# Patient Record
Sex: Male | Born: 1955 | Race: Black or African American | Hispanic: No | Marital: Married | Smoking: Never smoker
Health system: Southern US, Community
[De-identification: ages and names within clinical notes are randomized; demographics above are authoritative.]

## PROBLEM LIST (undated history)

## (undated) DIAGNOSIS — M1990 Unspecified osteoarthritis, unspecified site: Secondary | ICD-10-CM

## (undated) DIAGNOSIS — G4730 Sleep apnea, unspecified: Secondary | ICD-10-CM

## (undated) DIAGNOSIS — I10 Essential (primary) hypertension: Secondary | ICD-10-CM

## (undated) HISTORY — PX: JOINT REPLACEMENT: SHX530

## (undated) HISTORY — PX: APPENDECTOMY: SHX54

## (undated) HISTORY — PX: LAPAROSCOPIC GASTRIC BANDING: SHX1100

---

## 2006-07-20 ENCOUNTER — Encounter: Admission: RE | Admit: 2006-07-20 | Discharge: 2006-10-18 | Payer: Self-pay | Admitting: Endocrinology

## 2007-02-21 ENCOUNTER — Ambulatory Visit (HOSPITAL_COMMUNITY): Admission: RE | Admit: 2007-02-21 | Discharge: 2007-02-21 | Payer: Self-pay | Admitting: Endocrinology

## 2007-03-27 ENCOUNTER — Ambulatory Visit (HOSPITAL_COMMUNITY): Admission: RE | Admit: 2007-03-27 | Discharge: 2007-03-27 | Payer: Self-pay | Admitting: *Deleted

## 2010-10-11 NOTE — Op Note (Signed)
NAMEDIONICIO, SHELNUTT NO.:  [id number]   MEDICAL RECORD NO.:  [id number]          PATIENT TYPE:  AMB   LOCATION:  ENDO                         FACILITY:  [facility]   PHYSICIAN:  Georgiana Spinner, M.D.    DATE OF BIRTH:  1955-12-25   DATE OF PROCEDURE:  03/26/2007  DATE OF DISCHARGE:                               OPERATIVE REPORT   PROCEDURE:  Colonoscopy.   INDICATIONS:  Abdominal pain, thickening of the colon seen on previous x-  ray exam.   ANESTHESIA:  Demerol 70 mg, Versed 10 mg.   PROCEDURE:  With the patient mildly sedated in the left lateral  decubitus position, a rectal exam was attempted and which was normal to  my limited examination.  Subsequently the Pentax videoscopic colonoscope  was inserted into the rectum, passed under direct vision to the cecum  identified by ileocecal valve and appendiceal orifice both of which were  photographed.  The prep was poor in that there was solid stool coating  the colon in various places mostly on the right side which was not  question on the x-ray but from this point the colonoscope was then  slowly withdrawn taking circumferential views of colonic mucosa,  stopping only then in the rectum which appeared normal on direct and  retroflexed view.  The endoscope was straightened and withdrawn.  The  patient's vital signs, pulse oximeter remained stable.  The patient  tolerated procedure well without apparent complications.   FINDINGS:  Poor prep precluded thorough examination so small lesions,  especially in the right side could be missed there was thickened stool  and that was difficult to suction on the left colon as well but no gross  lesions were seen.   PLAN:  Consider repeat examination in 5 years with improvement prep  quality.           ______________________________  Georgiana Spinner, M.D.     GMO/MEDQ  D:  03/27/2007  T:  03/27/2007  Job:  [id number]

## 2021-03-17 ENCOUNTER — Other Ambulatory Visit: Payer: Self-pay | Admitting: Orthopaedic Surgery

## 2021-03-17 DIAGNOSIS — Z01818 Encounter for other preprocedural examination: Secondary | ICD-10-CM

## 2021-04-07 ENCOUNTER — Encounter (HOSPITAL_COMMUNITY): Payer: No Typology Code available for payment source

## 2021-04-13 NOTE — Progress Notes (Addendum)
PCP - [facility]. [facility]   LOV Vincent Silva, [place] 03-22-21 on chart Cardiologist - no  PPM/ICD -  Device Orders -  Rep Notified -   Chest x-ray -  EKG - 03-22-21 on chart Stress Test -  ECHO -  Cardiac Cath -  CMP,HgbA1c, CBC/diff, PT/PTT,03-22-21 on chart  Sleep Study -  CPAP -   Fasting Blood Sugar - 130-150 Checks Blood Sugar _____ times a day  Blood Thinner Instructions: Aspirin Instructions:  ERAS Protcol - PRE-SURGERY Ensure   COVID TEST- N/A COVID vaccine -Moderna x3  Activity--Goodnow to ambulate in home with cane with no SOB Anesthesia review: HTN  Patient denies shortness of breath, fever, cough and chest pain at PAT appointment   All instructions explained to the patient, with a verbal understanding of the material. Patient agrees to go over the instructions while at home for a better understanding. Patient also instructed to self quarantine after being tested for COVID-19. The opportunity to ask questions was provided.

## 2021-04-13 NOTE — Patient Instructions (Addendum)
DUE TO COVID-19 ONLY ONE VISITOR IS ALLOWED TO COME WITH YOU AND STAY IN THE WAITING ROOM ONLY DURING PRE OP AND PROCEDURE DAY OF SURGERY.   Up to two visitors ages 16+ are allowed at one time in a patient's room.  The visitors may rotate out with other people throughout the day.  Additionally, up to two children between the ages of 91 and 39 are allowed and do not count toward the number of allowed visitors.  Children within this age range must be accompanied by an adult visitor.  One adult visitor may remain with the patient overnight and must be in the room by 8 PM.            Your procedure is scheduled on: 04-19-21   Report to [facility] Main  Entrance   Report to admitting at       0715 AM     Call this number if you have problems the morning of surgery [phone]   Remember: NO SOLID FOOD AFTER MIDNIGHT THE NIGHT PRIOR TO SURGERY. NOTHING BY MOUTH EXCEPT CLEAR LIQUIDS UNTIL    0700 am .   PLEASE FINISH ENSURE DRINK PER SURGEON ORDER  WHICH NEEDS TO BE COMPLETED AT    0700 am the morning of surgery  then nothing by mouth .     CLEAR LIQUID DIET                                                                    water Black Coffee and tea, regular and decaf No Creamer                            Plain Jell-O any favor except red or purple                                  Fruit ices (not with fruit pulp)                                      Iced Popsicles                                     Carbonated beverages, regular and diet                                    Cranberry, grape and apple juices Sports drinks like Gatorade Lightly seasoned clear broth or consume(fat free) Sugar, honey syrup   _____________________________________________________________________     BRUSH YOUR TEETH MORNING OF SURGERY AND RINSE YOUR MOUTH OUT, NO CHEWING GUM CANDY OR MINTS.     Take these medicines the morning of surgery with A SIP OF WATER: metoprolol                                  You may not have any metal on  your body including hair pins and              piercings  Do not wear jewelry, lotions, powders,perfumes,    or    deodorant                        Men may shave face and neck.   Do not bring valuables to the hospital. [facility] IS NOT             RESPONSIBLE   FOR VALUABLES.  Contacts, dentures or bridgework may not be worn into surgery.  You may bring a small overnight bag with you     Patients discharged the day of surgery will not be allowed to drive home. IF YOU ARE HAVING SURGERY AND GOING HOME THE SAME DAY, YOU MUST HAVE AN ADULT TO DRIVE YOU HOME AND BE WITH YOU FOR 24 HOURS. YOU MAY GO HOME BY TAXI OR UBER OR ORTHERWISE, BUT AN ADULT MUST ACCOMPANY YOU HOME AND STAY WITH YOU FOR 24 HOURS.  Name and phone number of your driver:  Special Instructions: N/A              Please read over the following fact sheets you were given: _____________________________________________________________________             [facility] - Preparing for Surgery Before surgery, you can play an important role.  Because skin is not sterile, your skin needs to be as free of germs as possible.  You can reduce the number of germs on your skin by washing with CHG (chlorahexidine gluconate) soap before surgery.  CHG is an antiseptic cleaner which kills germs and bonds with the skin to continue killing germs even after washing. Please DO NOT use if you have an allergy to CHG or antibacterial soaps.  If your skin becomes reddened/irritated stop using the CHG and inform your nurse when you arrive at Short Stay. Do not shave (including legs and underarms) for at least 48 hours prior to the first CHG shower.  You may shave your face/neck. Please follow these instructions carefully:  1.  Shower with CHG Soap the night before surgery and the  morning of Surgery.  2.  If you choose to wash your hair, wash your hair first as usual with your  normal  shampoo.  3.  After you  shampoo, rinse your hair and body thoroughly to remove the  shampoo.                           4.  Use CHG as you would any other liquid soap.  You can apply chg directly  to the skin and wash                       Gently with a scrungie or clean washcloth.  5.  Apply the CHG Soap to your body ONLY FROM THE NECK DOWN.   Do not use on face/ open                           Wound or open sores. Avoid contact with eyes, ears mouth and genitals (private parts).                       Wash face,  Genitals (private parts) with your normal soap.  6.  Wash thoroughly, paying special attention to the area where your surgery  will be performed.  7.  Thoroughly rinse your body with warm water from the neck down.  8.  DO NOT shower/wash with your normal soap after using and rinsing off  the CHG Soap.                9.  Pat yourself dry with a clean towel.            10.  Wear clean pajamas.            11.  Place clean sheets on your bed the night of your first shower and do not  sleep with pets. Day of Surgery : Do not apply any lotions/deodorants the morning of surgery.  Please wear clean clothes to the hospital/surgery center.  FAILURE TO FOLLOW THESE INSTRUCTIONS MAY RESULT IN THE CANCELLATION OF YOUR SURGERY PATIENT SIGNATURE_________________________________  NURSE SIGNATURE__________________________________  ________________________________________________________________________    Vincent Silva  An incentive spirometer is a tool that can help keep your lungs clear and active. This tool measures how well you are filling your lungs with each breath. Taking long deep breaths may help reverse or decrease the chance of developing breathing (pulmonary) problems (especially infection) following: A long period of time when you are unable to move or be active. BEFORE THE PROCEDURE  If the spirometer includes an indicator to show your best effort, your nurse or respiratory therapist will  set it to a desired goal. If possible, sit up straight or lean slightly forward. Try not to slouch. Hold the incentive spirometer in an upright position. INSTRUCTIONS FOR USE  Sit on the edge of your bed if possible, or sit up as far as you can in bed or on a chair. Hold the incentive spirometer in an upright position. Breathe out normally. Place the mouthpiece in your mouth and seal your lips tightly around it. Breathe in slowly and as deeply as possible, raising the piston or the ball toward the top of the column. Hold your breath for 3-5 seconds or for as long as possible. Allow the piston or ball to fall to the bottom of the column. Remove the mouthpiece from your mouth and breathe out normally. Rest for a few seconds and repeat Steps 1 through 7 at least 10 times every 1-2 hours when you are awake. Take your time and take a few normal breaths between deep breaths. The spirometer may include an indicator to show your best effort. Use the indicator as a goal to work toward during each repetition. After each set of 10 deep breaths, practice coughing to be sure your lungs are clear. If you have an incision (the cut made at the time of surgery), support your incision when coughing by placing a pillow or rolled up towels firmly against it. Once you are Zepeda to get out of bed, walk around indoors and cough well. You may stop using the incentive spirometer when instructed by your caregiver.  RISKS AND COMPLICATIONS Take your time so you do not get dizzy or light-headed. If you are in pain, you may need to take or ask for pain medication before doing incentive spirometry. It is harder to take a deep breath if you are having pain. AFTER USE Rest and breathe slowly and easily. It can be helpful to keep track of a log of your progress. Your caregiver can provide you with a simple table to help with this. If you are  using the spirometer at home, follow these instructions: SEEK MEDICAL CARE IF:  You  are having difficultly using the spirometer. You have trouble using the spirometer as often as instructed. Your pain medication is not giving enough relief while using the spirometer. You develop fever of 100.5 F (38.1 C) or higher. SEEK IMMEDIATE MEDICAL CARE IF:  You cough up bloody sputum that had not been present before. You develop fever of 102 F (38.9 C) or greater. You develop worsening pain at or near the incision site. MAKE SURE YOU:  Understand these instructions. Will watch your condition. Will get help right away if you are not doing well or get worse. Document Released: 09/25/2006 Document Revised: 08/07/2011 Document Reviewed: 11/26/2006 [facility] Patient Information 2014 [place], [place].   ________________________________________________________________________

## 2021-04-14 ENCOUNTER — Encounter (HOSPITAL_COMMUNITY): Payer: Self-pay

## 2021-04-14 ENCOUNTER — Ambulatory Visit (HOSPITAL_COMMUNITY)
Admission: RE | Admit: 2021-04-14 | Discharge: 2021-04-14 | Disposition: A | Discharge status: Home / Self Care | Payer: No Typology Code available for payment source | Source: Ambulatory Visit | Attending: Orthopaedic Surgery | Admitting: Orthopaedic Surgery

## 2021-04-14 ENCOUNTER — Encounter (HOSPITAL_COMMUNITY)
Admission: RE | Admit: 2021-04-14 | Discharge: 2021-04-14 | Disposition: A | Discharge status: Home / Self Care | Payer: No Typology Code available for payment source | Source: Ambulatory Visit | Attending: Orthopaedic Surgery | Admitting: Orthopaedic Surgery

## 2021-04-14 ENCOUNTER — Other Ambulatory Visit: Payer: Self-pay

## 2021-04-14 VITALS — BP 127/82 | HR 75 | Temp 97.7°F | Resp 16 | Ht 74.0 in | Wt 297.0 lb

## 2021-04-14 DIAGNOSIS — Z01818 Encounter for other preprocedural examination: Secondary | ICD-10-CM | POA: Diagnosis present

## 2021-04-14 HISTORY — DX: Unspecified osteoarthritis, unspecified site: M19.90

## 2021-04-14 HISTORY — DX: Sleep apnea, unspecified: G47.30

## 2021-04-14 HISTORY — DX: Essential (primary) hypertension: I10

## 2021-04-14 LAB — URINALYSIS, ROUTINE W REFLEX MICROSCOPIC
Bilirubin Urine: NEGATIVE
Glucose, UA: NEGATIVE mg/dL
Hgb urine dipstick: NEGATIVE
Ketones, ur: 5 mg/dL — AB
Nitrite: NEGATIVE
Protein, ur: NEGATIVE mg/dL
Specific Gravity, Urine: 1.02 (ref 1.005–1.030)
pH: 5 (ref 5.0–8.0)

## 2021-04-14 LAB — SURGICAL PCR SCREEN
MRSA, PCR: NEGATIVE
Staphylococcus aureus: NEGATIVE

## 2021-04-18 NOTE — Anesthesia Preprocedure Evaluation (Addendum)
Anesthesia Evaluation  Patient identified by MRN, date of birth, ID band Patient awake    Reviewed: Allergy & Precautions, NPO status , Patient's Chart, lab work & pertinent test results  Airway Mallampati: II  TM Distance: >3 FB Neck ROM: Full    Dental no notable dental hx. (+) Teeth Intact, Dental Advisory Given   Pulmonary sleep apnea and Continuous Positive Airway Pressure Ventilation ,    Pulmonary exam normal breath sounds clear to auscultation       Cardiovascular hypertension, Pt. on medications Normal cardiovascular exam Rhythm:Regular Rate:Normal     Neuro/Psych negative neurological ROS  negative psych ROS   GI/Hepatic Neg liver ROS,   Endo/Other  negative endocrine ROS  Renal/GU      Musculoskeletal  (+) Arthritis ,   Abdominal (+) + obese ( BMI 38.13),   Peds  Hematology   Anesthesia Other Findings   Reproductive/Obstetrics                            Anesthesia Physical Anesthesia Plan  ASA: 2  Anesthesia Plan: Spinal   Post-op Pain Management: Minimal or no pain anticipated   Induction: Intravenous  PONV Risk Score and Plan: 2 and Treatment may vary due to age or medical condition, Ondansetron, Midazolam and Dexamethasone  Airway Management Planned: Nasal Cannula and Natural Airway  Additional Equipment: None  Intra-op Plan:   Post-operative Plan:   Informed Consent: I have reviewed the patients History and Physical, chart, labs and discussed the procedure including the risks, benefits and alternatives for the proposed anesthesia with the patient or authorized representative who has indicated his/her understanding and acceptance.     Dental advisory given  Plan Discussed with:   Anesthesia Plan Comments: (Spinal)       Anesthesia Quick Evaluation

## 2021-04-18 NOTE — H&P (Signed)
TOTAL HIP ADMISSION H&P  Patient is admitted for right total hip arthroplasty.  Subjective:  Chief Complaint: right hip pain  HPI: Vincent Silva, 65 y.o. male, has a history of pain and functional disability in the right hip(s) due to arthritis and patient has failed non-surgical conservative treatments for greater than 12 weeks to include NSAID's and/or analgesics, corticosteriod injections, flexibility and strengthening excercises, use of assistive devices, weight reduction as appropriate, and activity modification.  Onset of symptoms was gradual starting 5 years ago with gradually worsening course since that time.The patient noted no past surgery on the right hip(s).  Patient currently rates pain in the right hip at 10 out of 10 with activity. Patient has night pain, worsening of pain with activity and weight bearing, trendelenberg gait, pain that interfers with activities of daily living, and crepitus. Patient has evidence of subchondral cysts, subchondral sclerosis, periarticular osteophytes, and joint space narrowing by imaging studies. This condition presents safety issues increasing the risk of falls. There is no current active infection.  There are no problems to display for this patient.  Past Medical History:  Diagnosis Date   Arthritis    Hypertension    Sleep apnea    wears cpap    Past Surgical History:  Procedure Laterality Date   APPENDECTOMY     JOINT REPLACEMENT Bilateral    knee   LAPAROSCOPIC GASTRIC BANDING      No current facility-administered medications for this encounter.   Current Outpatient Medications  Medication Sig Dispense Refill Last Dose   celecoxib (CELEBREX) 200 MG capsule Take 200 mg by mouth 2 (two) times daily.      cetirizine (ZYRTEC) 10 MG tablet Take 10 mg by mouth daily as needed for allergies.      cyclobenzaprine (FLEXERIL) 10 MG tablet Take 10 mg by mouth at bedtime as needed for muscle spasms.      fluticasone (FLONASE) 50 MCG/ACT nasal  spray Place 2 sprays into both nostrils daily as needed for allergies or rhinitis.      lisinopril (ZESTRIL) 20 MG tablet Take 20 mg by mouth daily.      lisinopril-hydrochlorothiazide (ZESTORETIC) 20-25 MG tablet Take 1 tablet by mouth daily.      metoprolol succinate (TOPROL-XL) 50 MG 24 hr tablet Take 50 mg by mouth daily. Take with or immediately following a meal.      tadalafil (CIALIS) 20 MG tablet Take 20 mg by mouth daily as needed for erectile dysfunction.      No Known Allergies  Social History   Tobacco Use   Smoking status: Never   Smokeless tobacco: Never  Substance Use Topics   Alcohol use: Yes    Comment: occasional    No family history on file.   Review of Systems  Musculoskeletal:  Positive for arthralgias.       Right hip  All other systems reviewed and are negative.  Objective:  Physical Exam Constitutional:      Appearance: Normal appearance.  HENT:     Head: Normocephalic and atraumatic.     Nose: Nose normal.     Mouth/Throat:     Pharynx: Oropharynx is clear.  Eyes:     Extraocular Movements: Extraocular movements intact.     Conjunctiva/sclera: Conjunctivae normal.  Cardiovascular:     Rate and Rhythm: Normal rate.  Pulmonary:     Effort: Pulmonary effort is normal.  Abdominal:     Palpations: Abdomen is soft.  Musculoskeletal:  Cervical back: Normal range of motion.     Comments: Both hips are limited in terms of motion.  He has a great deal of pain on attempted internal rotation right greater than left.  Leg lengths are roughly equal.  Sensation and motor function are intact in his feet with palpable pulses on both sides.  Skin:    General: Skin is warm and dry.  Neurological:     General: No focal deficit present.     Mental Status: He is alert and oriented to person, place, and time. Mental status is at baseline.  Psychiatric:        Mood and Affect: Mood normal.        Behavior: Behavior normal.        Thought Content: Thought  content normal.        Judgment: Judgment normal.    Vital signs in last 24 hours:    Labs:   Estimated body mass index is 38.13 kg/m as calculated from the following:   Height as of 04/14/21: 6\' 2"  (1.88 m).   Weight as of 04/14/21: 134.7 kg.   Imaging Review Plain radiographs demonstrate severe degenerative joint disease of the right hip(s). The bone quality appears to be good for age and reported activity level.   Assessment/Plan:  End stage primary arthritis, right hip(s)  The patient history, physical examination, clinical judgement of the provider and imaging studies are consistent with end stage degenerative joint disease of the right hip(s) and total hip arthroplasty is deemed medically necessary. The treatment options including medical management, injection therapy, arthroscopy and arthroplasty were discussed at length. The risks and benefits of total hip arthroplasty were presented and reviewed. The risks due to aseptic loosening, infection, stiffness, dislocation/subluxation,  thromboembolic complications and other imponderables were discussed.  The patient acknowledged the explanation, agreed to proceed with the plan and consent was signed. Patient is being admitted for inpatient treatment for surgery, pain control, PT, OT, prophylactic antibiotics, VTE prophylaxis, progressive ambulation and ADL's and discharge planning.The patient is planning to be discharged home with home health services

## 2021-04-19 ENCOUNTER — Ambulatory Visit (HOSPITAL_COMMUNITY): Payer: No Typology Code available for payment source

## 2021-04-19 ENCOUNTER — Ambulatory Visit (HOSPITAL_COMMUNITY): Payer: No Typology Code available for payment source | Admitting: Anesthesiology

## 2021-04-19 ENCOUNTER — Observation Stay (HOSPITAL_COMMUNITY)
Admission: RE | Admit: 2021-04-19 | Discharge: 2021-04-19 | Disposition: A | Discharge status: Home / Self Care | Payer: No Typology Code available for payment source | Source: Ambulatory Visit | Attending: Orthopaedic Surgery | Admitting: Orthopaedic Surgery

## 2021-04-19 ENCOUNTER — Encounter (HOSPITAL_COMMUNITY): Payer: Self-pay | Admitting: Orthopaedic Surgery

## 2021-04-19 ENCOUNTER — Encounter (HOSPITAL_COMMUNITY)
Admission: RE | Disposition: A | Discharge status: Home / Self Care | Payer: Self-pay | Source: Ambulatory Visit | Attending: Orthopaedic Surgery

## 2021-04-19 DIAGNOSIS — Z96653 Presence of artificial knee joint, bilateral: Secondary | ICD-10-CM | POA: Insufficient documentation

## 2021-04-19 DIAGNOSIS — M1611 Unilateral primary osteoarthritis, right hip: Principal | ICD-10-CM | POA: Diagnosis present

## 2021-04-19 DIAGNOSIS — I10 Essential (primary) hypertension: Secondary | ICD-10-CM | POA: Insufficient documentation

## 2021-04-19 DIAGNOSIS — Z419 Encounter for procedure for purposes other than remedying health state, unspecified: Secondary | ICD-10-CM

## 2021-04-19 DIAGNOSIS — Z79899 Other long term (current) drug therapy: Secondary | ICD-10-CM | POA: Diagnosis not present

## 2021-04-19 HISTORY — PX: TOTAL HIP ARTHROPLASTY: SHX124

## 2021-04-19 LAB — TYPE AND SCREEN
ABO/RH(D): O POS
Antibody Screen: NEGATIVE

## 2021-04-19 LAB — ABO/RH: ABO/RH(D): O POS

## 2021-04-19 SURGERY — ARTHROPLASTY, HIP, TOTAL, ANTERIOR APPROACH
Anesthesia: Spinal | Site: Hip | Laterality: Right

## 2021-04-19 MED ORDER — ORAL CARE MOUTH RINSE: 15.0000 mL | Freq: Once | OROMUCOSAL | Status: AC

## 2021-04-19 MED ORDER — BUPIVACAINE LIPOSOME 1.3 % IJ SUSP
INTRAMUSCULAR | Status: AC
  Filled 2021-04-19: qty 10

## 2021-04-19 MED ORDER — BUPIVACAINE IN DEXTROSE 0.75-8.25 % IT SOLN
INTRATHECAL | Status: DC | PRN
  Administered 2021-04-19: 13.5 mg via INTRATHECAL

## 2021-04-19 MED ORDER — LACTATED RINGERS IV SOLN: INTRAVENOUS | Status: DC

## 2021-04-19 MED ORDER — ALUM & MAG HYDROXIDE-SIMETH 200-200-20 MG/5ML PO SUSP: 30.0000 mL | ORAL | Status: DC | PRN

## 2021-04-19 MED ORDER — ONDANSETRON HCL 4 MG/2ML IJ SOLN: 4.0000 mg | Freq: Four times a day (QID) | INTRAMUSCULAR | Status: DC | PRN

## 2021-04-19 MED ORDER — METOCLOPRAMIDE HCL 5 MG PO TABS
5.0000 mg | ORAL_TABLET | Freq: Three times a day (TID) | ORAL | Status: DC | PRN
Start: 2021-04-19 — End: 2021-04-19
  Filled 2021-04-19: qty 2

## 2021-04-19 MED ORDER — MENTHOL 3 MG MT LOZG: 1.0000 | LOZENGE | OROMUCOSAL | Status: DC | PRN

## 2021-04-19 MED ORDER — HYDROCODONE-ACETAMINOPHEN 5-325 MG PO TABS: 1.0000 | ORAL_TABLET | Freq: Four times a day (QID) | ORAL | 0 refills | Status: DC | PRN

## 2021-04-19 MED ORDER — METHOCARBAMOL 500 MG PO TABS: 500.0000 mg | ORAL_TABLET | Freq: Four times a day (QID) | ORAL | Status: DC | PRN

## 2021-04-19 MED ORDER — CEFAZOLIN SODIUM-DEXTROSE 2-4 GM/100ML-% IV SOLN
2.0000 g | Freq: Four times a day (QID) | INTRAVENOUS | Status: DC
  Administered 2021-04-19: 2 g via INTRAVENOUS

## 2021-04-19 MED ORDER — FENTANYL CITRATE (PF) 100 MCG/2ML IJ SOLN
INTRAMUSCULAR | Status: AC
  Filled 2021-04-19: qty 2

## 2021-04-19 MED ORDER — TRANEXAMIC ACID 1000 MG/10ML IV SOLN
INTRAVENOUS | Status: DC | PRN
  Administered 2021-04-19: 2000 mg via TOPICAL

## 2021-04-19 MED ORDER — HYDROCODONE-ACETAMINOPHEN 5-325 MG PO TABS
1.0000 | ORAL_TABLET | ORAL | Status: DC | PRN
  Administered 2021-04-19: 2 via ORAL

## 2021-04-19 MED ORDER — PHENOL 1.4 % MT LIQD: 1.0000 | OROMUCOSAL | Status: DC | PRN

## 2021-04-19 MED ORDER — ACETAMINOPHEN 325 MG PO TABS: 325.0000 mg | ORAL_TABLET | Freq: Four times a day (QID) | ORAL | Status: DC | PRN

## 2021-04-19 MED ORDER — PHENYLEPHRINE HCL (PRESSORS) 10 MG/ML IV SOLN
INTRAVENOUS | Status: AC
  Filled 2021-04-19: qty 1

## 2021-04-19 MED ORDER — PROPOFOL 500 MG/50ML IV EMUL
INTRAVENOUS | Status: DC | PRN
  Administered 2021-04-19: 100 ug/kg/min via INTRAVENOUS

## 2021-04-19 MED ORDER — KETOROLAC TROMETHAMINE 15 MG/ML IJ SOLN
15.0000 mg | Freq: Four times a day (QID) | INTRAMUSCULAR | Status: DC
  Administered 2021-04-19: 15 mg via INTRAVENOUS

## 2021-04-19 MED ORDER — CEFAZOLIN IN SODIUM CHLORIDE 3-0.9 GM/100ML-% IV SOLN
3.0000 g | INTRAVENOUS | Status: AC
  Administered 2021-04-19: 3 g via INTRAVENOUS
  Filled 2021-04-19: qty 100

## 2021-04-19 MED ORDER — LACTATED RINGERS IV BOLUS
250.0000 mL | Freq: Once | INTRAVENOUS | Status: AC
  Administered 2021-04-19: 250 mL via INTRAVENOUS

## 2021-04-19 MED ORDER — MORPHINE SULFATE (PF) 4 MG/ML IV SOLN
INTRAVENOUS | Status: AC
  Filled 2021-04-19: qty 1

## 2021-04-19 MED ORDER — CHLORHEXIDINE GLUCONATE 0.12 % MT SOLN
15.0000 mL | Freq: Once | OROMUCOSAL | Status: AC
  Administered 2021-04-19: 15 mL via OROMUCOSAL

## 2021-04-19 MED ORDER — HYDROCODONE-ACETAMINOPHEN 5-325 MG PO TABS
ORAL_TABLET | ORAL | Status: AC
  Filled 2021-04-19: qty 2

## 2021-04-19 MED ORDER — TRANEXAMIC ACID-NACL 1000-0.7 MG/100ML-% IV SOLN
INTRAVENOUS | Status: AC
  Administered 2021-04-19: 1000 mg
  Filled 2021-04-19: qty 100

## 2021-04-19 MED ORDER — EPHEDRINE SULFATE-NACL 50-0.9 MG/10ML-% IV SOSY
PREFILLED_SYRINGE | INTRAVENOUS | Status: DC | PRN
  Administered 2021-04-19 (×2): 10 mg via INTRAVENOUS
  Administered 2021-04-19: 5 mg via INTRAVENOUS

## 2021-04-19 MED ORDER — METHOCARBAMOL 500 MG IVPB - SIMPLE MED
INTRAVENOUS | Status: AC
  Filled 2021-04-19: qty 50

## 2021-04-19 MED ORDER — MORPHINE SULFATE (PF) 4 MG/ML IV SOLN
0.5000 mg | INTRAVENOUS | Status: DC | PRN
Start: 2021-04-19 — End: 2021-04-19
  Administered 2021-04-19: 1 mg via INTRAVENOUS

## 2021-04-19 MED ORDER — HYDROCODONE-ACETAMINOPHEN 7.5-325 MG PO TABS: 1.0000 | ORAL_TABLET | ORAL | Status: DC | PRN

## 2021-04-19 MED ORDER — BUPIVACAINE-EPINEPHRINE (PF) 0.25% -1:200000 IJ SOLN
INTRAMUSCULAR | Status: AC
  Filled 2021-04-19: qty 30

## 2021-04-19 MED ORDER — BUPIVACAINE-EPINEPHRINE (PF) 0.25% -1:200000 IJ SOLN
INTRAMUSCULAR | Status: DC | PRN
  Administered 2021-04-19: 30 mL

## 2021-04-19 MED ORDER — POVIDONE-IODINE 10 % EX SWAB
2.0000 "application " | Freq: Once | CUTANEOUS | Status: AC
  Administered 2021-04-19: 2 via TOPICAL

## 2021-04-19 MED ORDER — DOCUSATE SODIUM 100 MG PO CAPS: 100.0000 mg | ORAL_CAPSULE | Freq: Two times a day (BID) | ORAL | Status: DC

## 2021-04-19 MED ORDER — DIPHENHYDRAMINE HCL 12.5 MG/5ML PO ELIX
12.5000 mg | ORAL_SOLUTION | ORAL | Status: DC | PRN
  Filled 2021-04-19: qty 10

## 2021-04-19 MED ORDER — ASPIRIN 81 MG PO CHEW: 81.0000 mg | CHEWABLE_TABLET | Freq: Two times a day (BID) | ORAL | Status: DC

## 2021-04-19 MED ORDER — BUPIVACAINE LIPOSOME 1.3 % IJ SUSP: 10.0000 mL | Freq: Once | INTRAMUSCULAR | Status: DC

## 2021-04-19 MED ORDER — CEFAZOLIN SODIUM-DEXTROSE 2-4 GM/100ML-% IV SOLN
INTRAVENOUS | Status: AC
  Filled 2021-04-19: qty 100

## 2021-04-19 MED ORDER — TRANEXAMIC ACID-NACL 1000-0.7 MG/100ML-% IV SOLN: 1000.0000 mg | Freq: Once | INTRAVENOUS | Status: DC

## 2021-04-19 MED ORDER — METHOCARBAMOL 500 MG IVPB - SIMPLE MED
500.0000 mg | Freq: Four times a day (QID) | INTRAVENOUS | Status: DC | PRN
  Administered 2021-04-19: 500 mg via INTRAVENOUS

## 2021-04-19 MED ORDER — ASPIRIN EC 81 MG PO TBEC: 81.0000 mg | DELAYED_RELEASE_TABLET | Freq: Two times a day (BID) | ORAL | 0 refills | Status: DC

## 2021-04-19 MED ORDER — ONDANSETRON HCL 4 MG PO TABS
4.0000 mg | ORAL_TABLET | Freq: Four times a day (QID) | ORAL | Status: DC | PRN
  Filled 2021-04-19: qty 1

## 2021-04-19 MED ORDER — PHENYLEPHRINE 40 MCG/ML (10ML) SYRINGE FOR IV PUSH (FOR BLOOD PRESSURE SUPPORT)
PREFILLED_SYRINGE | INTRAVENOUS | Status: AC
  Filled 2021-04-19: qty 10

## 2021-04-19 MED ORDER — MIDAZOLAM HCL 5 MG/5ML IJ SOLN
INTRAMUSCULAR | Status: DC | PRN
  Administered 2021-04-19: 2 mg via INTRAVENOUS

## 2021-04-19 MED ORDER — 0.9 % SODIUM CHLORIDE (POUR BTL) OPTIME
TOPICAL | Status: DC | PRN
  Administered 2021-04-19: 1000 mL

## 2021-04-19 MED ORDER — TRANEXAMIC ACID 1000 MG/10ML IV SOLN
2000.0000 mg | INTRAVENOUS | Status: DC
  Filled 2021-04-19: qty 20

## 2021-04-19 MED ORDER — FENTANYL CITRATE (PF) 100 MCG/2ML IJ SOLN
INTRAMUSCULAR | Status: DC | PRN
  Administered 2021-04-19 (×2): 50 ug via INTRAVENOUS

## 2021-04-19 MED ORDER — TRANEXAMIC ACID-NACL 1000-0.7 MG/100ML-% IV SOLN
1000.0000 mg | INTRAVENOUS | Status: AC
  Administered 2021-04-19: 1000 mg via INTRAVENOUS
  Filled 2021-04-19: qty 100

## 2021-04-19 MED ORDER — MIDAZOLAM HCL 2 MG/2ML IJ SOLN
INTRAMUSCULAR | Status: AC
  Filled 2021-04-19: qty 2

## 2021-04-19 MED ORDER — TIZANIDINE HCL 4 MG PO TABS: 4.0000 mg | ORAL_TABLET | Freq: Four times a day (QID) | ORAL | 1 refills | Status: DC | PRN

## 2021-04-19 MED ORDER — ACETAMINOPHEN 500 MG PO TABS: 500.0000 mg | ORAL_TABLET | Freq: Four times a day (QID) | ORAL | Status: DC

## 2021-04-19 MED ORDER — METOCLOPRAMIDE HCL 5 MG/ML IJ SOLN: 5.0000 mg | Freq: Three times a day (TID) | INTRAMUSCULAR | Status: DC | PRN

## 2021-04-19 MED ORDER — BISACODYL 5 MG PO TBEC: 5.0000 mg | DELAYED_RELEASE_TABLET | Freq: Every day | ORAL | Status: DC | PRN

## 2021-04-19 MED ORDER — BUPIVACAINE LIPOSOME 1.3 % IJ SUSP
INTRAMUSCULAR | Status: DC | PRN
  Administered 2021-04-19: 10 mL

## 2021-04-19 MED ORDER — LACTATED RINGERS IV BOLUS
500.0000 mL | Freq: Once | INTRAVENOUS | Status: AC
  Administered 2021-04-19: 500 mL via INTRAVENOUS

## 2021-04-19 MED ORDER — EPHEDRINE 5 MG/ML INJ
INTRAVENOUS | Status: AC
  Filled 2021-04-19: qty 5

## 2021-04-19 MED ORDER — KETOROLAC TROMETHAMINE 15 MG/ML IJ SOLN
INTRAMUSCULAR | Status: AC
  Filled 2021-04-19: qty 1

## 2021-04-19 MED ORDER — PROPOFOL 10 MG/ML IV BOLUS
INTRAVENOUS | Status: AC
  Filled 2021-04-19: qty 20

## 2021-04-19 MED ORDER — KETAMINE HCL 10 MG/ML IJ SOLN
INTRAMUSCULAR | Status: AC
  Filled 2021-04-19: qty 1

## 2021-04-19 MED ORDER — PHENYLEPHRINE HCL-NACL 20-0.9 MG/250ML-% IV SOLN
INTRAVENOUS | Status: DC | PRN
  Administered 2021-04-19: 40 ug/min via INTRAVENOUS

## 2021-04-19 SURGICAL SUPPLY — 43 items
BAG COUNTER SPONGE SURGICOUNT (BAG) ×2 IMPLANT
BAG DECANTER FOR FLEXI CONT (MISCELLANEOUS) ×2 IMPLANT
BLADE SAW SGTL 18X1.27X75 (BLADE) ×2 IMPLANT
BOOTIES KNEE HIGH SLOAN (MISCELLANEOUS) ×2 IMPLANT
CELLS DAT CNTRL 66122 CELL SVR (MISCELLANEOUS) ×1 IMPLANT
COVER PERINEAL POST (MISCELLANEOUS) ×2 IMPLANT
COVER SURGICAL LIGHT HANDLE (MISCELLANEOUS) ×2 IMPLANT
CUP PINN GRIPTON 54 100 (Cup) ×2 IMPLANT
DECANTER SPIKE VIAL GLASS SM (MISCELLANEOUS) ×2 IMPLANT
DRAPE FOOT SWITCH (DRAPES) ×2 IMPLANT
DRAPE IMP U-DRAPE 54X76 (DRAPES) ×2 IMPLANT
DRAPE STERI IOBAN 125X83 (DRAPES) ×2 IMPLANT
DRAPE U-SHAPE 47X51 STRL (DRAPES) ×4 IMPLANT
DRSG AQUACEL AG ADV 3.5X 6 (GAUZE/BANDAGES/DRESSINGS) ×2 IMPLANT
DURAPREP 26ML APPLICATOR (WOUND CARE) ×2 IMPLANT
ELECT BLADE TIP CTD 4 INCH (ELECTRODE) ×2 IMPLANT
ELECT REM PT RETURN 15FT ADLT (MISCELLANEOUS) ×2 IMPLANT
ELIMINATOR HOLE APEX DEPUY (Hips) ×2 IMPLANT
GLOVE SRG 8 PF TXTR STRL LF DI (GLOVE) ×2 IMPLANT
GLOVE SURG ENC MOIS LTX SZ8 (GLOVE) ×4 IMPLANT
GLOVE SURG UNDER POLY LF SZ8 (GLOVE) ×4
GOWN STRL REUS W/TWL XL LVL3 (GOWN DISPOSABLE) ×4 IMPLANT
HEAD CERAMIC DELTA 36 PLUS 1.5 (Hips) ×2 IMPLANT
HOLDER FOLEY CATH W/STRAP (MISCELLANEOUS) ×2 IMPLANT
KIT TURNOVER KIT A (KITS) IMPLANT
LINER NEUTRAL 54X36MM PLUS 4 (Hips) ×2 IMPLANT
MANIFOLD NEPTUNE II (INSTRUMENTS) ×2 IMPLANT
NEEDLE HYPO 22GX1.5 SAFETY (NEEDLE) ×2 IMPLANT
NS IRRIG 1000ML POUR BTL (IV SOLUTION) ×2 IMPLANT
PACK ANTERIOR HIP CUSTOM (KITS) ×2 IMPLANT
PENCIL SMOKE EVACUATOR (MISCELLANEOUS) IMPLANT
PROTECTOR NERVE ULNAR (MISCELLANEOUS) ×2 IMPLANT
RTRCTR WOUND ALEXIS 18CM MED (MISCELLANEOUS) ×2
SPONGE T-LAP 18X18 ~~LOC~~+RFID (SPONGE) ×4 IMPLANT
STEM FEMORAL SZ9 HIGH ACTIS (Stem) ×2 IMPLANT
SUT ETHIBOND NAB CT1 #1 30IN (SUTURE) ×4 IMPLANT
SUT VIC AB 1 CT1 36 (SUTURE) ×2 IMPLANT
SUT VIC AB 2-0 CT1 27 (SUTURE) ×2
SUT VIC AB 2-0 CT1 TAPERPNT 27 (SUTURE) ×1 IMPLANT
SUT VICRYL AB 3-0 FS1 BRD 27IN (SUTURE) ×2 IMPLANT
SUT VLOC 180 0 24IN GS25 (SUTURE) ×2 IMPLANT
SYR 50ML LL SCALE MARK (SYRINGE) ×2 IMPLANT
TRAY FOLEY MTR SLVR 16FR STAT (SET/KITS/TRAYS/PACK) ×2 IMPLANT

## 2021-04-19 NOTE — Evaluation (Signed)
Physical Therapy Evaluation Patient Details Name: Vincent Silva MRN: [id number] DOB: 03/27/1956 Today's Date: 04/19/2021  History of Present Illness  Patient is 65 y.o. male s/p Rt THA anterior approach on 04/19/21 with PMH significant for OA, HTN, bil TKA.  Clinical Impression  Vincent Silva is a 65 y.o. male POD 0 s/p Rt THA. Patient reports independence with [facility] for mobility at baseline. Patient is now limited by functional impairments (see PT problem list below) and requires min guard for transfers and gait with RW. Patient was [place]ward to ambulate ~100 feet with RW and min guard and cues for safe walker management. Patient educated on safe sequencing for stair mobility and verbalized safe guarding position for people assisting with mobility. Pt's wife educated on safe guarding/assist to provide pt with gait and stair mobility. Patient instructed in exercises to facilitate ROM and circulation. Patient will benefit from continued skilled PT interventions to address impairments and progress towards PLOF. Patient has met mobility goals at adequate level for discharge home; will continue to follow if pt continues acute stay to progress towards Mod I goals.        Recommendations for follow up therapy are one component of a multi-disciplinary discharge planning process, led by the attending physician.  Recommendations may be updated based on patient status, additional functional criteria and insurance authorization.  Follow Up Recommendations Follow physician's recommendations for discharge plan and follow up therapies    Assistance Recommended at Discharge Frequent or constant Supervision/Assistance  Functional Status Assessment Patient has had a recent decline in their functional status and demonstrates the ability to make significant improvements in function in a reasonable and predictable amount of time.  Equipment Recommendations  None recommended by PT    Recommendations for Other Services        Precautions / Restrictions Precautions Precautions: Fall Restrictions Weight Bearing Restrictions: No Other Position/Activity Restrictions: WBAT      Mobility  Bed Mobility Overal bed mobility: Needs Assistance Bed Mobility: Supine to Sit     Supine to sit: Min guard;Supervision;HOB elevated     General bed mobility comments: pt taking extra time, cues for use of belt to assist Lt LE.    Transfers Overall transfer level: Needs assistance Equipment used: Rolling walker (2 wheels) Transfers: Sit to/from Stand Sit to Stand: Min guard;From elevated surface           General transfer comment: cues for technique/hand placement on RW, no overt LOB noted with rise. pt with good power up using bil UE's from EOB and elevated recliner.    Ambulation/Gait Ambulation/Gait assistance: Min guard Gait Distance (Feet): 100 Feet Assistive device: Rolling walker (2 wheels) Gait Pattern/deviations: Step-to pattern;Decreased stride length;Decreased weight shift to right;Trunk flexed Gait velocity: decr     General Gait Details: cues for step to pattern and proximity to RW, no overt LOB noted and pt steady with wife providing guarding with cues/supervision from therapist.  Stairs Stairs: Yes Stairs assistance: Min guard Stair Management: One rail Left;Step to pattern;Forwards;With cane Number of Stairs: 3 General stair comments: cuse for sequencing up with good, down with bad and SPC placement. no overt LOB noted. pt's wife provided guarding and assist with cues/instructions from therapist.  Wheelchair Mobility    Modified Rankin (Stroke Patients Only)       Balance Overall balance assessment: Needs assistance Sitting-balance support: Feet supported Sitting balance-Leahy Scale: Good     Standing balance support: During functional activity;Reliant on assistive device for balance;Bilateral upper extremity  supported Standing balance-Leahy Scale: Poor                                Pertinent Vitals/Pain Pain Assessment: Faces Faces Pain Scale: Hurts little more Pain Location: Rt hip Pain Descriptors / Indicators: Aching;Discomfort Pain Intervention(s): Limited activity within patient's tolerance;Monitored during session;Repositioned;Premedicated before session;Ice applied    Home Living Family/patient expects to be discharged to:: Private residence Living Arrangements: Spouse/significant other Available Help at Discharge: Family Type of Home: House Home Access: Stairs to enter Entrance Stairs-Rails: Left Entrance Stairs-Number of Steps: 6   Home Layout: One level Home Equipment: Conservation officer, nature (2 wheels);Cane - single point;BSC/3in1;Toilet riser Additional Comments: pt's wife will assist with his recovery    Prior Function Prior Level of Function : Independent/Modified Independent             Mobility Comments: using SPC       Hand Dominance   Dominant Hand: Right    Extremity/Trunk Assessment   Upper Extremity Assessment Upper Extremity Assessment: Overall WFL for tasks assessed    Lower Extremity Assessment Lower Extremity Assessment: Overall WFL for tasks assessed;RLE deficits/detail RLE: Unable to fully assess due to pain RLE Sensation: WNL RLE Coordination: WNL    Cervical / Trunk Assessment Cervical / Trunk Assessment: Normal;Other exceptions Cervical / Trunk Exceptions: habitus  Communication   Communication: No difficulties  Cognition Arousal/Alertness: Awake/alert Behavior During Therapy: WFL for tasks assessed/performed Overall Cognitive Status: Within Functional Limits for tasks assessed                                          General Comments      Exercises Total Joint Exercises Ankle Circles/Pumps: AROM;Both;Seated;10 reps Quad Sets: AROM;Right;Other reps (comment);Seated Short Arc Quad: AROM;Right;Other reps (comment);Seated Heel Slides: AROM;Right;Other reps  (comment);Seated Hip ABduction/ADduction: AROM;Right;Other reps (comment);Seated   Assessment/Plan    PT Assessment Patient needs continued PT services  PT Problem List Decreased strength;Decreased activity tolerance;Decreased range of motion;Decreased balance;Decreased mobility;Decreased knowledge of use of DME;Decreased knowledge of precautions;Pain       PT Treatment Interventions DME instruction;Gait training;Stair training;Functional mobility training;Therapeutic activities;Therapeutic exercise;Balance training;Patient/family education    PT Goals (Current goals can be found in the Care Plan section)  Acute Rehab PT Goals Patient Stated Goal: get recovered to go home today PT Goal Formulation: With patient Time For Goal Achievement: 04/26/21 Potential to Achieve Goals: Good    Frequency 7X/week   Barriers to discharge        Co-evaluation               AM-PAC PT "6 Clicks" Mobility  Outcome Measure Help needed turning from your back to your side while in a flat bed without using bedrails?: A Little Help needed moving from lying on your back to sitting on the side of a flat bed without using bedrails?: A Little Help needed moving to and from a bed to a chair (including a wheelchair)?: A Little Help needed standing up from a chair using your arms (e.g., wheelchair or bedside chair)?: A Little Help needed to walk in hospital room?: A Little Help needed climbing 3-5 steps with a railing? : A Little 6 Click Score: 18    End of Session Equipment Utilized During Treatment: Gait belt Activity Tolerance: Patient tolerated treatment well Patient left: in chair;with  family/visitor present;with call bell/phone within reach Nurse Communication: Mobility status PT Visit Diagnosis: Muscle weakness (generalized) (M62.81);Difficulty in walking, not elsewhere classified (R26.2)    Time: 0160-1093 PT Time Calculation (min) (ACUTE ONLY): 64 min   Charges:   PT Evaluation $PT  Eval Low Complexity: 1 Low PT Treatments $Gait Training: 23-37 mins $Therapeutic Exercise: 8-22 mins        Verner Mould, DPT Acute Rehabilitation Services Office [phone] Pager [phone]   Jacques Navy 04/19/2021, 5:32 PM

## 2021-04-19 NOTE — Anesthesia Postprocedure Evaluation (Signed)
Anesthesia Post Note  Patient: Vincent Silva  Procedure(s) Performed: RIGHT TOTAL HIP ARTHROPLASTY ANTERIOR APPROACH (Right: Hip)     Patient location during evaluation: Nursing Unit Anesthesia Type: Spinal Level of consciousness: oriented and awake and alert Pain management: pain level controlled Vital Signs Assessment: post-procedure vital signs reviewed and stable Respiratory status: spontaneous breathing and respiratory function stable Cardiovascular status: blood pressure returned to baseline and stable Postop Assessment: no headache, no backache, no apparent nausea or vomiting and [name] to bend at knees Anesthetic complications: no   No notable events documented.  Last Vitals:  Vitals:   04/19/21 1100 04/19/21 1115  BP: 119/83 134/87  Pulse: (!) 48 (!) 51  Resp: 16 12  Temp:    SpO2: 100% 94%    Last Pain:  Vitals:   04/19/21 1115  TempSrc:   PainSc: 0-No pain                 Trevor Iha

## 2021-04-19 NOTE — Op Note (Signed)

## 2021-04-19 NOTE — Anesthesia Procedure Notes (Signed)
Spinal  Patient location during procedure: OR Start time: 04/19/2021 7:32 AM End time: 04/19/2021 7:40 AM Reason for block: surgical anesthesia Staffing Performed: anesthesiologist  Anesthesiologist: Trevor Iha, MD Preanesthetic Checklist Completed: patient identified, IV checked, risks and benefits discussed, surgical consent, monitors and equipment checked, pre-op evaluation and timeout performed Spinal Block Patient position: sitting Prep: DuraPrep and site prepped and draped Patient monitoring: heart rate, cardiac monitor, continuous pulse ox and blood pressure Approach: midline Location: L3-4 Injection technique: single-shot Needle Needle type: Pencan  Needle gauge: 24 G Needle length: 10 cm Needle insertion depth: 7 cm Assessment Sensory level: T4 Events: CSF return Additional Notes 2 Attempt (s). Pt tolerated procedure well.

## 2021-04-19 NOTE — Interval H&P Note (Signed)
History and Physical Interval Note:  04/19/2021 7:25 AM  Vincent Silva  has presented today for surgery, with the diagnosis of RIGHT HIP DEGENERATIVE JOINT DISEASE.  The various methods of treatment have been discussed with the patient and family. After consideration of risks, benefits and other options for treatment, the patient has consented to  Procedure(s): RIGHT TOTAL HIP ARTHROPLASTY ANTERIOR APPROACH (Right) as a surgical intervention.  The patient's history has been reviewed, patient examined, no change in status, stable for surgery.  I have reviewed the patient's chart and labs.  Questions were answered to the patient's satisfaction.     Velna Ochs

## 2021-04-19 NOTE — Transfer of Care (Signed)
Immediate Anesthesia Transfer of Care Note  Patient: Vincent Silva  Procedure(s) Performed: RIGHT TOTAL HIP ARTHROPLASTY ANTERIOR APPROACH (Right: Hip)  Patient Location: PACU  Anesthesia Type:MAC and Spinal  Level of Consciousness: awake, alert , oriented and patient cooperative  Airway & Oxygen Therapy: Patient Spontanous Breathing and Patient connected to face mask oxygen  Post-op Assessment: Report given to RN and Post -op Vital signs reviewed and stable  Post vital signs: Reviewed and stable  Last Vitals:  Vitals Value Taken Time  BP    Temp    Pulse    Resp    SpO2      Last Pain:  Vitals:   04/19/21 0604  TempSrc: Oral  PainSc:          Complications: No notable events documented.

## 2021-04-20 ENCOUNTER — Encounter (HOSPITAL_COMMUNITY): Payer: Self-pay | Admitting: Orthopaedic Surgery

## 2021-07-13 DIAGNOSIS — Z9889 Other specified postprocedural states: Secondary | ICD-10-CM | POA: Diagnosis not present

## 2021-07-13 DIAGNOSIS — M25551 Pain in right hip: Secondary | ICD-10-CM | POA: Diagnosis not present

## 2021-07-19 ENCOUNTER — Other Ambulatory Visit: Payer: Self-pay | Admitting: Orthopaedic Surgery

## 2021-07-21 DIAGNOSIS — M159 Polyosteoarthritis, unspecified: Secondary | ICD-10-CM | POA: Diagnosis not present

## 2021-07-21 DIAGNOSIS — I10 Essential (primary) hypertension: Secondary | ICD-10-CM | POA: Diagnosis not present

## 2021-07-21 DIAGNOSIS — Z01818 Encounter for other preprocedural examination: Secondary | ICD-10-CM | POA: Diagnosis not present

## 2021-07-21 DIAGNOSIS — E7800 Pure hypercholesterolemia, unspecified: Secondary | ICD-10-CM | POA: Diagnosis not present

## 2021-08-09 NOTE — Progress Notes (Signed)
DUE TO COVID-19 ONLY ONE VISITOR IS ALLOWED TO COME WITH YOU AND STAY IN THE WAITING ROOM ONLY DURING PRE OP AND PROCEDURE DAY OF SURGERY.  2 VISITOR  MAY VISIT WITH YOU AFTER SURGERY IN YOUR PRIVATE ROOM DURING VISITING HOURS ONLY! ?YOU MAY HAVE ONE PERSON SPEND THE NITE WITH YOU IN YOUR ROOM AFTER SURGERY.   ? ? ? ? Your procedure is scheduled on:  ?  08/16/21  ? Report to [facility] Main  Entrance ? ? Report to admitting at    0515            AM ?DO NOT BRING INSURANCE CARD, PICTURE ID OR WALLET DAY OF SURGERY.  ?  ? ? Call this number if you have problems the morning of surgery [phone]  ? ? REMEMBER: NO  SOLID FOODS , CANDY, GUM OR MINTS AFTER MIDNITE THE NITE BEFORE SURGERY .       Marland Kitchen CLEAR LIQUIDS UNTIL     0430am           DAY OF SURGERY.      PLEASE FINISH ENSURE DRINK PER SURGEON ORDER  WHICH NEEDS TO BE COMPLETED AT      0430am      MORNING OF SURGERY.   ? ? ? ? ?CLEAR LIQUID DIET ? ? ?Foods Allowed      ?WATER ?BLACK COFFEE ( SUGAR OK, NO MILK, CREAM OR CREAMER) REGULAR AND DECAF  ?TEA ( SUGAR OK NO MILK, CREAM, OR CREAMER) REGULAR AND DECAF  ?PLAIN JELLO ( NO RED)  ?FRUIT ICES ( NO RED, NO FRUIT PULP)  ?POPSICLES ( NO RED)  ?JUICE- APPLE, WHITE GRAPE AND WHITE CRANBERRY  ?SPORT DRINK LIKE GATORADE ( NO RED)  ?CLEAR BROTH ( VEGETABLE , CHICKEN OR BEEF)                                                               ? ?    ? ?BRUSH YOUR TEETH MORNING OF SURGERY AND RINSE YOUR MOUTH OUT, NO CHEWING GUM CANDY OR MINTS. ?  ? ? Take these medicines the morning of surgery with A SIP OF WATER:  toprol  ? ? ?DO NOT TAKE ANY DIABETIC MEDICATIONS DAY OF YOUR SURGERY ?                  ?            You may not have any metal on your body including hair pins and  ?            piercings  Do not wear jewelry, make-up, lotions, powders or perfumes, deodorant ?            Do not wear nail polish on your fingernails.   ?           IF YOU ARE A MALE AND WANT TO SHAVE UNDER ARMS OR LEGS PRIOR TO SURGERY YOU  MUST DO SO AT LEAST 48 HOURS PRIOR TO SURGERY.  ?            Men may shave face and neck. ? ? Do not bring valuables to the hospital. [facility] IS NOT ?            RESPONSIBLE   FOR  VALUABLES. ? Contacts, dentures or bridgework may not be worn into surgery. ? Leave suitcase in the car. After surgery it may be brought to your room. ? ?  ? Patients discharged the day of surgery will not be allowed to drive home. IF YOU ARE HAVING SURGERY AND GOING HOME THE SAME DAY, YOU MUST HAVE AN ADULT TO DRIVE YOU HOME AND BE WITH YOU FOR 24 HOURS. YOU MAY GO HOME BY TAXI OR UBER OR ORTHERWISE, BUT AN ADULT MUST ACCOMPANY YOU HOME AND STAY WITH YOU FOR 24 HOURS. ?  ? ?            Please read over the following fact sheets you were given: ?_____________________________________________________________________ ? ?[place] - Preparing for Surgery ?Before surgery, you can play an important role.  Because skin is not sterile, your skin needs to be as free of germs as possible.  You can reduce the number of germs on your skin by washing with CHG (chlorahexidine gluconate) soap before surgery.  CHG is an antiseptic cleaner which kills germs and bonds with the skin to continue killing germs even after washing. ?Please DO NOT use if you have an allergy to CHG or antibacterial soaps.  If your skin becomes reddened/irritated stop using the CHG and inform your nurse when you arrive at Short Stay. ?Do not shave (including legs and underarms) for at least 48 hours prior to the first CHG shower.  You may shave your face/neck. ?Please follow these instructions carefully: ? 1.  Shower with CHG Soap the night before surgery and the  morning of Surgery. ? 2.  If you choose to wash your hair, wash your hair first as usual with your  normal  shampoo. ? 3.  After you shampoo, rinse your hair and body thoroughly to remove the  shampoo.                           4.  Use CHG as you would any other liquid soap.  You can apply chg directly  to the skin  and wash  ?                     Gently with a scrungie or clean washcloth. ? 5.  Apply the CHG Soap to your body ONLY FROM THE NECK DOWN.   Do not use on face/ open      ?                     Wound or open sores. Avoid contact with eyes, ears mouth and genitals (private parts).  ?                     Engineering geologist,  Genitals (private parts) with your normal soap. ?            6.  Wash thoroughly, paying special attention to the area where your surgery  will be performed. ? 7.  Thoroughly rinse your body with warm water from the neck down. ? 8.  DO NOT shower/wash with your normal soap after using and rinsing off  the CHG Soap. ?               9.  Pat yourself dry with a clean towel. ?           10.  Wear clean pajamas. ?  11.  Place clean sheets on your bed the night of your first shower and do not  sleep with pets. ?Day of Surgery : ?Do not apply any lotions/deodorants the morning of surgery.  Please wear clean clothes to the hospital/surgery center. ? ?FAILURE TO FOLLOW THESE INSTRUCTIONS MAY RESULT IN THE CANCELLATION OF YOUR SURGERY ?PATIENT SIGNATURE_________________________________ ? ?NURSE SIGNATURE__________________________________ ? ?________________________________________________________________________  ? ? ?           ?

## 2021-08-09 NOTE — Progress Notes (Addendum)
Anesthesia Review: ? ?PCP: Dr Nicholos Johns  LOV - approx 3 weeks ago to be faxed by office - pt stated he saw for clearance 07/21/21 on chart - for preop clearance - seen by Windle Guard  ?Also lov 03/22/21 on chart  ?Cardiologist : none  ?Chest x-ray : 04/14/21- in epic no recent respiratory or llung issues  ?EKG : 07/21/21 on chart  ?Echo : ?Stress test: ?Cardiac Cath :  ?Activity level:  can do a flgith of stairs without difficulty  ?Sleep Study/ CPAP : has cpap  ?Fasting Blood Sugar :      / Checks Blood Sugar -- times a day:   ?Blood Thinner/ Instructions /Last Dose: ?ASA / Instructions/ Last Dose :   ?81 mg Aspirin  ?Blood pressure at prep was / in left arm an/d in right arm.  PT denies any chest pain, dizziness, shortness of breath or headache.  PT states he monitor blood pressure at home and is normally 120-130/80.  Leticia Clas made aware.  No new orders given.  ?Pt aware at time of preop appt that if he develops any s/s of covid to notify surgeon and PCP and to be tested prior to surgery.  PT voiced understanding.  ?

## 2021-08-11 ENCOUNTER — Encounter (HOSPITAL_COMMUNITY)
Admission: RE | Admit: 2021-08-11 | Discharge: 2021-08-11 | Disposition: A | Discharge status: Home / Self Care | Payer: BC Managed Care – PPO | Source: Ambulatory Visit | Attending: Orthopaedic Surgery | Admitting: Orthopaedic Surgery

## 2021-08-11 ENCOUNTER — Encounter (HOSPITAL_COMMUNITY): Payer: Self-pay

## 2021-08-11 ENCOUNTER — Other Ambulatory Visit: Payer: Self-pay

## 2021-08-11 VITALS — BP 171/102 | HR 55 | Temp 98.3°F | Resp 16 | Ht 74.0 in | Wt 294.0 lb

## 2021-08-11 DIAGNOSIS — Z01812 Encounter for preprocedural laboratory examination: Secondary | ICD-10-CM | POA: Diagnosis not present

## 2021-08-11 DIAGNOSIS — Z01818 Encounter for other preprocedural examination: Secondary | ICD-10-CM

## 2021-08-11 LAB — CBC
HCT: 44.2 % (ref 39.0–52.0)
Hemoglobin: 14.8 g/dL (ref 13.0–17.0)
MCH: 30.8 pg (ref 26.0–34.0)
MCHC: 33.5 g/dL (ref 30.0–36.0)
MCV: 92.1 fL (ref 80.0–100.0)
Platelets: 229 10*3/uL (ref 150–400)
RBC: 4.8 MIL/uL (ref 4.22–5.81)
RDW: 14.1 % (ref 11.5–15.5)
WBC: 3.4 10*3/uL — ABNORMAL LOW (ref 4.0–10.5)
nRBC: 0 % (ref 0.0–0.2)

## 2021-08-11 LAB — BASIC METABOLIC PANEL
Anion gap: 8 (ref 5–15)
BUN: 15 mg/dL (ref 8–23)
CO2: 28 mmol/L (ref 22–32)
Calcium: 8.9 mg/dL (ref 8.9–10.3)
Chloride: 100 mmol/L (ref 98–111)
Creatinine, Ser: 0.97 mg/dL (ref 0.61–1.24)
GFR, Estimated: >60 mL/min (ref >60)
Glucose, Bld: 103 mg/dL — ABNORMAL HIGH (ref 70–99)
Potassium: 3.5 mmol/L (ref 3.5–5.1)
Sodium: 136 mmol/L (ref 135–145)

## 2021-08-11 LAB — SURGICAL PCR SCREEN
MRSA, PCR: NEGATIVE
Staphylococcus aureus: NEGATIVE

## 2021-08-15 ENCOUNTER — Encounter (HOSPITAL_COMMUNITY): Payer: Self-pay | Admitting: Orthopaedic Surgery

## 2021-08-15 MED ORDER — TRANEXAMIC ACID 1000 MG/10ML IV SOLN
2000.0000 mg | INTRAVENOUS | Status: DC
  Filled 2021-08-15: qty 20

## 2021-08-15 NOTE — Anesthesia Preprocedure Evaluation (Addendum)
Anesthesia Evaluation  ?Patient identified by MRN, date of birth, ID band ?Patient awake ? ? ? ?Reviewed: ?Allergy & Precautions, NPO status , Patient's Chart, lab work & pertinent test results ? ?Airway ?Mallampati: II ? ?TM Distance: >3 FB ?Neck ROM: Full ? ? ? Dental ?no notable dental hx. ? ?  ?Pulmonary ?sleep apnea ,  ?  ?Pulmonary exam normal ?breath sounds clear to auscultation ? ? ? ? ? ? Cardiovascular ?hypertension, Pt. on medications and Pt. on home beta blockers ?Normal cardiovascular exam ?Rhythm:Regular Rate:Normal ? ? ?  ?Neuro/Psych ?negative neurological ROS ? negative psych ROS  ? GI/Hepatic ?negative GI ROS, Neg liver ROS,   ?Endo/Other  ?Morbid obesity ? Renal/GU ?negative Renal ROS  ?negative genitourinary ?  ?Musculoskeletal ?negative musculoskeletal ROS ?(+)  ? Abdominal ?  ?Peds ?negative pediatric ROS ?(+)  Hematology ?negative hematology ROS ?(+)   ?Anesthesia Other Findings ? ? Reproductive/Obstetrics ?negative OB ROS ? ?  ? ? ? ? ? ? ? ? ? ? ? ? ? ?  ?  ? ? ? ? ? ? ? ?Anesthesia Physical ?Anesthesia Plan ? ?ASA: 3 ? ?Anesthesia Plan: Spinal  ? ?Post-op Pain Management: Dilaudid IV  ? ?Induction: Intravenous ? ?PONV Risk Score and Plan: 1 and Propofol infusion and Treatment may vary due to age or medical condition ? ?Airway Management Planned: Simple Face Mask ? ?Additional Equipment:  ? ?Intra-op Plan:  ? ?Post-operative Plan:  ? ?Informed Consent: I have reviewed the patients History and Physical, chart, labs and discussed the procedure including the risks, benefits and alternatives for the proposed anesthesia with the patient or authorized representative who has indicated his/her understanding and acceptance.  ? ? ? ?Dental advisory given ? ?Plan Discussed with: CRNA and Surgeon ? ?Anesthesia Plan Comments:   ? ? ? ? ? ? ?Anesthesia Quick Evaluation ? ?

## 2021-08-15 NOTE — H&P (Signed)
TOTAL HIP ADMISSION H&P ? ?Patient is admitted for left total hip arthroplasty. ? ?Subjective: ? ?Chief Complaint: left hip pain ? ?HPI: Vincent Silva, 66 y.o. male, has a history of pain and functional disability in the left hip(s) due to arthritis and patient has failed non-surgical conservative treatments for greater than 12 weeks to include NSAID's and/or analgesics, corticosteriod injections, flexibility and strengthening excercises, supervised PT with diminished ADL's post treatment, use of assistive devices, weight reduction as appropriate, and activity modification.  Onset of symptoms was gradual starting 5 years ago with gradually worsening course since that time.The patient noted no past surgery on the left hip(s).  Patient currently rates pain in the left hip at 10 out of 10 with activity. Patient has night pain, worsening of pain with activity and weight bearing, trendelenberg gait, pain that interfers with activities of daily living, and crepitus. Patient has evidence of subchondral cysts, subchondral sclerosis, periarticular osteophytes, and joint space narrowing by imaging studies. This condition presents safety issues increasing the risk of falls. There is no current active infection. ? ?Patient Active Problem List  ? Diagnosis Date Noted  ? Primary osteoarthritis of right hip 04/19/2021  ? ?Past Medical History:  ?Diagnosis Date  ? Arthritis   ? Hypertension   ? Sleep apnea   ? wears cpap  ?  ?Past Surgical History:  ?Procedure Laterality Date  ? APPENDECTOMY    ? JOINT REPLACEMENT Bilateral   ? knee  ? LAPAROSCOPIC GASTRIC BANDING    ? TOTAL HIP ARTHROPLASTY Right 04/19/2021  ? Procedure: RIGHT TOTAL HIP ARTHROPLASTY ANTERIOR APPROACH;  Surgeon: Marcene Corning, MD;  Location: [facility];  Service: Orthopedics;  Laterality: Right;  ?  ?Current Facility-Administered Medications  ?Medication Dose Route Frequency Provider Last Rate Last Admin  ? [START ON 08/16/2021] tranexamic acid (CYKLOKAPRON) 2,000 mg  in sodium chloride 0.9 % 50 mL Topical Application  2,000 mg Topical To OR Lynden Ang, RPH      ? ?Current Outpatient Medications  ?Medication Sig Dispense Refill Last Dose  ? celecoxib (CELEBREX) 200 MG capsule Take 200 mg by mouth 2 (two) times daily.     ? cetirizine (ZYRTEC) 10 MG tablet Take 10 mg by mouth daily as needed for allergies.     ? fluticasone (FLONASE) 50 MCG/ACT nasal spray Place 2 sprays into both nostrils daily as needed for allergies or rhinitis.     ? lisinopril (ZESTRIL) 20 MG tablet Take 20 mg by mouth daily.     ? lisinopril-hydrochlorothiazide (ZESTORETIC) 20-25 MG tablet Take 1 tablet by mouth daily.     ? metoprolol succinate (TOPROL-XL) 50 MG 24 hr tablet Take 50 mg by mouth daily. Take with or immediately following a meal.     ? tadalafil (CIALIS) 20 MG tablet Take 20 mg by mouth daily as needed for erectile dysfunction.     ? HYDROcodone-acetaminophen (NORCO/VICODIN) 5-325 MG tablet Take 1-2 tablets by mouth every 6 (six) hours as needed for moderate pain or severe pain (post op pain). (Patient not taking: Reported on 08/05/2021) 40 tablet 0 Not Taking  ? tiZANidine (ZANAFLEX) 4 MG tablet Take 1 tablet (4 mg total) by mouth every 6 (six) hours as needed for muscle spasms. (Patient not taking: Reported on 08/05/2021) 40 tablet 1 Not Taking  ? ?No Known Allergies  ?Social History  ? ?Tobacco Use  ? Smoking status: Never  ? Smokeless tobacco: Never  ?Substance Use Topics  ? Alcohol use: Yes  ?  Comment:  occasional  ?  ?History reviewed. No pertinent family history.  ? ?Review of Systems  ?Musculoskeletal:  Positive for arthralgias.  ?     Left hip  ?All other systems reviewed and are negative. ? ?Objective: ? ?Physical Exam ?Constitutional:   ?   Appearance: Normal appearance.  ?HENT:  ?   Head: Normocephalic and atraumatic.  ?   Nose: Nose normal.  ?   Mouth/Throat:  ?   Pharynx: Oropharynx is clear.  ?Eyes:  ?   Extraocular Movements: Extraocular movements intact.  ?Cardiovascular:  ?    Rate and Rhythm: Normal rate.  ?Pulmonary:  ?   Effort: Pulmonary effort is normal.  ?Abdominal:  ?   Palpations: Abdomen is soft.  ?Musculoskeletal:  ?   Cervical back: Normal range of motion.  ?   Comments: Left hip shows significantly limited range of motion with terrible pain.  He walks with an antalgic gait on the left.  Right hip range of motion is full and without pain.  He is neurovascularly intact distally bilaterally.  ?Skin: ?   General: Skin is warm and dry.  ?Neurological:  ?   General: No focal deficit present.  ?   Mental Status: He is alert and oriented to person, place, and time. Mental status is at baseline.  ?Psychiatric:     ?   Mood and Affect: Mood normal.     ?   Behavior: Behavior normal.     ?   Thought Content: Thought content normal.     ?   Judgment: Judgment normal.  ? ? ?Vital signs in last 24 hours: ?  ? ?Labs: ? ? ?Estimated body mass index is 37.75 kg/m? as calculated from the following: ?  Height as of 08/11/21: 6\' 2"  (1.88 m). ?  Weight as of 08/11/21: 133.4 kg. ? ? ?Imaging Review ?Plain radiographs demonstrate severe degenerative joint disease of the left hip(s). The bone quality appears to be good for age and reported activity level. ? ? ? ? ? ?Assessment/Plan: ? ?End stage primary arthritis, left hip(s) ? ?The patient history, physical examination, clinical judgement of the provider and imaging studies are consistent with end stage degenerative joint disease of the left hip(s) and total hip arthroplasty is deemed medically necessary. The treatment options including medical management, injection therapy, arthroscopy and arthroplasty were discussed at length. The risks and benefits of total hip arthroplasty were presented and reviewed. The risks due to aseptic loosening, infection, stiffness, dislocation/subluxation,  thromboembolic complications and other imponderables were discussed.  The patient acknowledged the explanation, agreed to proceed with the plan and consent was  signed. Patient is being admitted for inpatient treatment for surgery, pain control, PT, OT, prophylactic antibiotics, VTE prophylaxis, progressive ambulation and ADL's and discharge planning.The patient is planning to be discharged home with home health services ? ? ? ?

## 2021-08-16 ENCOUNTER — Ambulatory Visit (HOSPITAL_COMMUNITY): Payer: BC Managed Care – PPO | Admitting: Anesthesiology

## 2021-08-16 ENCOUNTER — Encounter (HOSPITAL_COMMUNITY)
Admission: RE | Disposition: A | Discharge status: Home / Self Care | Payer: Self-pay | Source: Home / Self Care | Attending: Orthopaedic Surgery

## 2021-08-16 ENCOUNTER — Encounter (HOSPITAL_COMMUNITY): Payer: Self-pay | Admitting: Orthopaedic Surgery

## 2021-08-16 ENCOUNTER — Ambulatory Visit (HOSPITAL_COMMUNITY)
Admission: RE | Admit: 2021-08-16 | Discharge: 2021-08-16 | Disposition: A | Discharge status: Home / Self Care | Payer: BC Managed Care – PPO | Attending: Orthopaedic Surgery | Admitting: Orthopaedic Surgery

## 2021-08-16 ENCOUNTER — Ambulatory Visit (HOSPITAL_COMMUNITY): Payer: BC Managed Care – PPO | Admitting: Physician Assistant

## 2021-08-16 ENCOUNTER — Ambulatory Visit (HOSPITAL_COMMUNITY): Payer: BC Managed Care – PPO

## 2021-08-16 DIAGNOSIS — I10 Essential (primary) hypertension: Secondary | ICD-10-CM | POA: Insufficient documentation

## 2021-08-16 DIAGNOSIS — Z471 Aftercare following joint replacement surgery: Secondary | ICD-10-CM | POA: Diagnosis not present

## 2021-08-16 DIAGNOSIS — Z6837 Body mass index (BMI) 37.0-37.9, adult: Secondary | ICD-10-CM | POA: Insufficient documentation

## 2021-08-16 DIAGNOSIS — M160 Bilateral primary osteoarthritis of hip: Secondary | ICD-10-CM | POA: Insufficient documentation

## 2021-08-16 DIAGNOSIS — Z96642 Presence of left artificial hip joint: Secondary | ICD-10-CM | POA: Diagnosis not present

## 2021-08-16 DIAGNOSIS — M1612 Unilateral primary osteoarthritis, left hip: Secondary | ICD-10-CM | POA: Diagnosis present

## 2021-08-16 DIAGNOSIS — G4730 Sleep apnea, unspecified: Secondary | ICD-10-CM | POA: Diagnosis not present

## 2021-08-16 HISTORY — PX: TOTAL HIP ARTHROPLASTY: SHX124

## 2021-08-16 LAB — TYPE AND SCREEN
ABO/RH(D): O POS
Antibody Screen: NEGATIVE

## 2021-08-16 SURGERY — ARTHROPLASTY, HIP, TOTAL, ANTERIOR APPROACH
Anesthesia: Spinal | Site: Hip | Laterality: Left

## 2021-08-16 MED ORDER — TRANEXAMIC ACID-NACL 1000-0.7 MG/100ML-% IV SOLN
1000.0000 mg | INTRAVENOUS | Status: AC
Start: 2021-08-16 — End: 2021-08-16
  Administered 2021-08-16: 1000 mg via INTRAVENOUS
  Filled 2021-08-16: qty 100

## 2021-08-16 MED ORDER — GLYCOPYRROLATE 0.2 MG/ML IJ SOLN
INTRAMUSCULAR | Status: AC
  Filled 2021-08-16: qty 2

## 2021-08-16 MED ORDER — OXYCODONE HCL 5 MG PO TABS
ORAL_TABLET | ORAL | Status: AC
  Filled 2021-08-16: qty 1

## 2021-08-16 MED ORDER — PHENYLEPHRINE HCL-NACL 20-0.9 MG/250ML-% IV SOLN
INTRAVENOUS | Status: AC
  Filled 2021-08-16: qty 1000

## 2021-08-16 MED ORDER — CEFAZOLIN SODIUM-DEXTROSE 1-4 GM/50ML-% IV SOLN: 1.0000 g | Freq: Four times a day (QID) | INTRAVENOUS | Status: DC

## 2021-08-16 MED ORDER — METHOCARBAMOL 500 MG IVPB - SIMPLE MED: 500.0000 mg | Freq: Four times a day (QID) | INTRAVENOUS | Status: DC | PRN

## 2021-08-16 MED ORDER — KETOROLAC TROMETHAMINE 15 MG/ML IJ SOLN
15.0000 mg | Freq: Four times a day (QID) | INTRAMUSCULAR | Status: DC
Start: 2021-08-16 — End: 2021-08-16

## 2021-08-16 MED ORDER — POVIDONE-IODINE 10 % EX SWAB
2.0000 "application " | Freq: Once | CUTANEOUS | Status: AC
  Administered 2021-08-16: 2 via TOPICAL

## 2021-08-16 MED ORDER — BUPIVACAINE LIPOSOME 1.3 % IJ SUSP
INTRAMUSCULAR | Status: AC
  Filled 2021-08-16: qty 10

## 2021-08-16 MED ORDER — BUPIVACAINE-EPINEPHRINE (PF) 0.25% -1:200000 IJ SOLN
INTRAMUSCULAR | Status: DC | PRN
  Administered 2021-08-16: 30 mL

## 2021-08-16 MED ORDER — LACTATED RINGERS IV SOLN
INTRAVENOUS | Status: DC
Start: 2021-08-16 — End: 2021-08-16

## 2021-08-16 MED ORDER — ACETAMINOPHEN 10 MG/ML IV SOLN
INTRAVENOUS | Status: AC
  Filled 2021-08-16: qty 100

## 2021-08-16 MED ORDER — HYDROMORPHONE HCL 1 MG/ML IJ SOLN
INTRAMUSCULAR | Status: AC
  Filled 2021-08-16: qty 1

## 2021-08-16 MED ORDER — HYDRALAZINE HCL 20 MG/ML IJ SOLN
10.0000 mg | Freq: Once | INTRAMUSCULAR | Status: AC
  Administered 2021-08-16: 10 mg via INTRAVENOUS

## 2021-08-16 MED ORDER — PROPOFOL 1000 MG/100ML IV EMUL
INTRAVENOUS | Status: AC
  Filled 2021-08-16: qty 100

## 2021-08-16 MED ORDER — MIDAZOLAM HCL 5 MG/5ML IJ SOLN
INTRAMUSCULAR | Status: DC | PRN
  Administered 2021-08-16: 2 mg via INTRAVENOUS

## 2021-08-16 MED ORDER — METHOCARBAMOL 500 MG PO TABS
500.0000 mg | ORAL_TABLET | Freq: Four times a day (QID) | ORAL | Status: DC | PRN
  Administered 2021-08-16: 500 mg via ORAL

## 2021-08-16 MED ORDER — HYDROCODONE-ACETAMINOPHEN 7.5-325 MG PO TABS
ORAL_TABLET | ORAL | Status: AC
  Filled 2021-08-16: qty 2

## 2021-08-16 MED ORDER — LACTATED RINGERS IV SOLN: INTRAVENOUS | Status: DC

## 2021-08-16 MED ORDER — CHLORHEXIDINE GLUCONATE 0.12 % MT SOLN
15.0000 mL | Freq: Once | OROMUCOSAL | Status: AC
  Administered 2021-08-16: 15 mL via OROMUCOSAL

## 2021-08-16 MED ORDER — OXYCODONE HCL 5 MG PO TABS
5.0000 mg | ORAL_TABLET | Freq: Once | ORAL | Status: AC | PRN
  Administered 2021-08-16: 5 mg via ORAL

## 2021-08-16 MED ORDER — LACTATED RINGERS IV BOLUS: 250.0000 mL | Freq: Once | INTRAVENOUS | Status: DC

## 2021-08-16 MED ORDER — PROPOFOL 10 MG/ML IV BOLUS
INTRAVENOUS | Status: DC | PRN
  Administered 2021-08-16: 20 mg via INTRAVENOUS

## 2021-08-16 MED ORDER — ACETAMINOPHEN 10 MG/ML IV SOLN
1000.0000 mg | Freq: Once | INTRAVENOUS | Status: DC | PRN
  Administered 2021-08-16: 1000 mg via INTRAVENOUS

## 2021-08-16 MED ORDER — HYDROCODONE-ACETAMINOPHEN 5-325 MG PO TABS: 1.0000 | ORAL_TABLET | Freq: Four times a day (QID) | ORAL | 0 refills | Status: AC | PRN

## 2021-08-16 MED ORDER — 0.9 % SODIUM CHLORIDE (POUR BTL) OPTIME
TOPICAL | Status: DC | PRN
  Administered 2021-08-16: 1000 mL

## 2021-08-16 MED ORDER — DEXAMETHASONE SODIUM PHOSPHATE 10 MG/ML IJ SOLN
INTRAMUSCULAR | Status: AC
  Filled 2021-08-16: qty 2

## 2021-08-16 MED ORDER — LIDOCAINE HCL (PF) 2 % IJ SOLN
INTRAMUSCULAR | Status: AC
  Filled 2021-08-16: qty 10

## 2021-08-16 MED ORDER — CEFAZOLIN SODIUM-DEXTROSE 2-4 GM/100ML-% IV SOLN
2.0000 g | INTRAVENOUS | Status: AC
  Administered 2021-08-16: 2 g via INTRAVENOUS
  Filled 2021-08-16: qty 100

## 2021-08-16 MED ORDER — LACTATED RINGERS IV BOLUS
250.0000 mL | Freq: Once | INTRAVENOUS | Status: DC
Start: 2021-08-16 — End: 2021-08-16

## 2021-08-16 MED ORDER — CYCLOBENZAPRINE HCL 5 MG PO TABS: 5.0000 mg | ORAL_TABLET | Freq: Four times a day (QID) | ORAL | 0 refills | Status: AC

## 2021-08-16 MED ORDER — PHENYLEPHRINE HCL-NACL 20-0.9 MG/250ML-% IV SOLN
INTRAVENOUS | Status: DC | PRN
  Administered 2021-08-16: 10 ug/min via INTRAVENOUS

## 2021-08-16 MED ORDER — TRANEXAMIC ACID 1000 MG/10ML IV SOLN
INTRAVENOUS | Status: DC | PRN
  Administered 2021-08-16: 2000 mg via TOPICAL

## 2021-08-16 MED ORDER — CEFAZOLIN SODIUM-DEXTROSE 2-4 GM/100ML-% IV SOLN
INTRAVENOUS | Status: AC
  Administered 2021-08-16: 2000 mg
  Filled 2021-08-16: qty 100

## 2021-08-16 MED ORDER — ONDANSETRON HCL 4 MG/2ML IJ SOLN
INTRAMUSCULAR | Status: DC | PRN
Start: 2021-08-16 — End: 2021-08-16
  Administered 2021-08-16: 4 mg via INTRAVENOUS

## 2021-08-16 MED ORDER — ORAL CARE MOUTH RINSE: 15.0000 mL | Freq: Once | OROMUCOSAL | Status: AC

## 2021-08-16 MED ORDER — OXYCODONE HCL 5 MG/5ML PO SOLN: 5.0000 mg | Freq: Once | ORAL | Status: AC | PRN

## 2021-08-16 MED ORDER — EPHEDRINE 5 MG/ML INJ
INTRAVENOUS | Status: AC
  Filled 2021-08-16: qty 5

## 2021-08-16 MED ORDER — GLYCOPYRROLATE 0.2 MG/ML IJ SOLN
INTRAMUSCULAR | Status: DC | PRN
  Administered 2021-08-16: .2 mg via INTRAVENOUS

## 2021-08-16 MED ORDER — BUPIVACAINE LIPOSOME 1.3 % IJ SUSP: 10.0000 mL | Freq: Once | INTRAMUSCULAR | Status: DC

## 2021-08-16 MED ORDER — HYDRALAZINE HCL 20 MG/ML IJ SOLN
INTRAMUSCULAR | Status: AC
  Filled 2021-08-16: qty 1

## 2021-08-16 MED ORDER — LACTATED RINGERS IV BOLUS
500.0000 mL | Freq: Once | INTRAVENOUS | Status: AC
  Administered 2021-08-16: 500 mL via INTRAVENOUS

## 2021-08-16 MED ORDER — BUPIVACAINE-EPINEPHRINE (PF) 0.25% -1:200000 IJ SOLN
INTRAMUSCULAR | Status: AC
  Filled 2021-08-16: qty 30

## 2021-08-16 MED ORDER — TRANEXAMIC ACID-NACL 1000-0.7 MG/100ML-% IV SOLN
1000.0000 mg | Freq: Once | INTRAVENOUS | Status: AC
  Administered 2021-08-16: 1000 mg via INTRAVENOUS

## 2021-08-16 MED ORDER — MORPHINE SULFATE (PF) 2 MG/ML IV SOLN
0.5000 mg | INTRAVENOUS | Status: DC | PRN
  Administered 2021-08-16: 1 mg via INTRAVENOUS

## 2021-08-16 MED ORDER — STERILE WATER FOR IRRIGATION IR SOLN
Status: DC | PRN
  Administered 2021-08-16: 1000 mL

## 2021-08-16 MED ORDER — FENTANYL CITRATE (PF) 100 MCG/2ML IJ SOLN
INTRAMUSCULAR | Status: AC
  Filled 2021-08-16: qty 2

## 2021-08-16 MED ORDER — METHOCARBAMOL 500 MG PO TABS
ORAL_TABLET | ORAL | Status: AC
  Filled 2021-08-16: qty 1

## 2021-08-16 MED ORDER — BUPIVACAINE IN DEXTROSE 0.75-8.25 % IT SOLN
INTRATHECAL | Status: DC | PRN
  Administered 2021-08-16: 2 mL via INTRATHECAL

## 2021-08-16 MED ORDER — HYDROMORPHONE HCL 1 MG/ML IJ SOLN
0.2500 mg | INTRAMUSCULAR | Status: DC | PRN
  Administered 2021-08-16 (×2): 0.5 mg via INTRAVENOUS

## 2021-08-16 MED ORDER — FENTANYL CITRATE (PF) 100 MCG/2ML IJ SOLN
INTRAMUSCULAR | Status: DC | PRN
  Administered 2021-08-16 (×2): 50 ug via INTRAVENOUS

## 2021-08-16 MED ORDER — HYDROCODONE-ACETAMINOPHEN 7.5-325 MG PO TABS: 1.0000 | ORAL_TABLET | ORAL | Status: DC | PRN

## 2021-08-16 MED ORDER — DEXAMETHASONE SODIUM PHOSPHATE 10 MG/ML IJ SOLN
INTRAMUSCULAR | Status: DC | PRN
  Administered 2021-08-16: 8 mg via INTRAVENOUS

## 2021-08-16 MED ORDER — BUPIVACAINE LIPOSOME 1.3 % IJ SUSP
INTRAMUSCULAR | Status: DC | PRN
  Administered 2021-08-16: 10 mL

## 2021-08-16 MED ORDER — PROPOFOL 10 MG/ML IV BOLUS
INTRAVENOUS | Status: AC
  Filled 2021-08-16: qty 20

## 2021-08-16 MED ORDER — MIDAZOLAM HCL 2 MG/2ML IJ SOLN
INTRAMUSCULAR | Status: AC
  Filled 2021-08-16: qty 2

## 2021-08-16 MED ORDER — PROPOFOL 500 MG/50ML IV EMUL
INTRAVENOUS | Status: DC | PRN
  Administered 2021-08-16: 75 ug/kg/min via INTRAVENOUS

## 2021-08-16 MED ORDER — HYDROCODONE-ACETAMINOPHEN 5-325 MG PO TABS: 1.0000 | ORAL_TABLET | ORAL | Status: DC | PRN

## 2021-08-16 MED ORDER — TRANEXAMIC ACID-NACL 1000-0.7 MG/100ML-% IV SOLN
INTRAVENOUS | Status: AC
  Filled 2021-08-16: qty 100

## 2021-08-16 MED ORDER — MORPHINE SULFATE (PF) 2 MG/ML IV SOLN
INTRAVENOUS | Status: AC
  Filled 2021-08-16: qty 1

## 2021-08-16 MED ORDER — LIDOCAINE 2% (20 MG/ML) 5 ML SYRINGE
INTRAMUSCULAR | Status: DC | PRN
Start: 2021-08-16 — End: 2021-08-16
  Administered 2021-08-16: 50 mg via INTRAVENOUS

## 2021-08-16 MED ORDER — ONDANSETRON HCL 4 MG/2ML IJ SOLN: 4.0000 mg | Freq: Once | INTRAMUSCULAR | Status: DC | PRN

## 2021-08-16 MED ORDER — PROPOFOL 500 MG/50ML IV EMUL
INTRAVENOUS | Status: AC
  Filled 2021-08-16: qty 50

## 2021-08-16 MED ORDER — ONDANSETRON HCL 4 MG/2ML IJ SOLN
INTRAMUSCULAR | Status: AC
  Filled 2021-08-16: qty 4

## 2021-08-16 SURGICAL SUPPLY — 45 items
BAG COUNTER SPONGE SURGICOUNT (BAG) IMPLANT
BAG DECANTER FOR FLEXI CONT (MISCELLANEOUS) ×2 IMPLANT
BLADE SAW SGTL 18X1.27X75 (BLADE) ×2 IMPLANT
BOOTIES KNEE HIGH SLOAN (MISCELLANEOUS) ×2 IMPLANT
CELLS DAT CNTRL 66122 CELL SVR (MISCELLANEOUS) ×1 IMPLANT
COVER PERINEAL POST (MISCELLANEOUS) ×2 IMPLANT
COVER SURGICAL LIGHT HANDLE (MISCELLANEOUS) ×2 IMPLANT
CUP PINN GRIPTON 54 100 (Cup) ×1 IMPLANT
DRAPE FOOT SWITCH (DRAPES) ×2 IMPLANT
DRAPE IMP U-DRAPE 54X76 (DRAPES) ×2 IMPLANT
DRAPE STERI IOBAN 125X83 (DRAPES) ×2 IMPLANT
DRAPE U-SHAPE 47X51 STRL (DRAPES) ×4 IMPLANT
DRSG AQUACEL AG ADV 3.5X 6 (GAUZE/BANDAGES/DRESSINGS) ×2 IMPLANT
DURAPREP 26ML APPLICATOR (WOUND CARE) ×2 IMPLANT
ELECT BLADE TIP CTD 4 INCH (ELECTRODE) ×2 IMPLANT
ELECT REM PT RETURN 15FT ADLT (MISCELLANEOUS) ×2 IMPLANT
ELIMINATOR HOLE APEX DEPUY (Hips) ×1 IMPLANT
GLOVE SRG 8 PF TXTR STRL LF DI (GLOVE) ×2 IMPLANT
GLOVE SURG ENC MOIS LTX SZ8 (GLOVE) ×4 IMPLANT
GLOVE SURG UNDER POLY LF SZ8 (GLOVE) ×4
GOWN STRL REUS W/ TWL XL LVL3 (GOWN DISPOSABLE) ×2 IMPLANT
GOWN STRL REUS W/TWL XL LVL3 (GOWN DISPOSABLE) ×4
HEAD M SROM 36MM 2 (Hips) IMPLANT
HOLDER FOLEY CATH W/STRAP (MISCELLANEOUS) ×2 IMPLANT
KIT TURNOVER KIT A (KITS) IMPLANT
LINER NEUTRAL 54X36MM PLUS 4 (Hips) ×1 IMPLANT
MANIFOLD NEPTUNE II (INSTRUMENTS) ×2 IMPLANT
NEEDLE HYPO 22GX1.5 SAFETY (NEEDLE) ×2 IMPLANT
NS IRRIG 1000ML POUR BTL (IV SOLUTION) ×2 IMPLANT
PACK ANTERIOR HIP CUSTOM (KITS) ×2 IMPLANT
PENCIL SMOKE EVACUATOR (MISCELLANEOUS) IMPLANT
PROTECTOR NERVE ULNAR (MISCELLANEOUS) ×2 IMPLANT
RETRACTOR WND ALEXIS 18 MED (MISCELLANEOUS) ×1 IMPLANT
RTRCTR WOUND ALEXIS 18CM MED (MISCELLANEOUS) ×2
SPIKE FLUID TRANSFER (MISCELLANEOUS) ×2 IMPLANT
SROM M HEAD 36MM 2 (Hips) ×2 IMPLANT
STEM FEMORAL SZ9 HIGH ACTIS (Stem) ×1 IMPLANT
SUT ETHIBOND NAB CT1 #1 30IN (SUTURE) ×4 IMPLANT
SUT VIC AB 1 CT1 36 (SUTURE) ×2 IMPLANT
SUT VIC AB 2-0 CT1 27 (SUTURE) ×2
SUT VIC AB 2-0 CT1 TAPERPNT 27 (SUTURE) ×1 IMPLANT
SUT VICRYL AB 3-0 FS1 BRD 27IN (SUTURE) ×2 IMPLANT
SUT VLOC 180 0 24IN GS25 (SUTURE) ×2 IMPLANT
SYR 50ML LL SCALE MARK (SYRINGE) ×2 IMPLANT
TRAY FOLEY MTR SLVR 16FR STAT (SET/KITS/TRAYS/PACK) ×2 IMPLANT

## 2021-08-16 NOTE — Anesthesia Postprocedure Evaluation (Signed)
Anesthesia Post Note ? ?Patient: Vincent Silva ? ?Procedure(s) Performed: LEFT TOTAL HIP ARTHROPLASTY ANTERIOR APPROACH (Left: Hip) ? ?  ? ?Patient location during evaluation: PACU ?Anesthesia Type: Spinal ?Level of consciousness: oriented and awake and alert ?Pain management: pain level controlled ?Vital Signs Assessment: post-procedure vital signs reviewed and stable ?Respiratory status: spontaneous breathing, respiratory function stable and patient connected to nasal cannula oxygen ?Cardiovascular status: blood pressure returned to baseline and stable ?Postop Assessment: no headache, no backache and no apparent nausea or vomiting ?Anesthetic complications: no ? ? ?No notable events documented. ? ?Last Vitals:  ?Vitals:  ? 08/16/21 1100 08/16/21 1145  ?BP: (!) 178/80 (!) 149/95  ?Pulse: (!) 43 (!) 57  ?Resp: 14 18  ?Temp:    ?SpO2: 100% 100%  ?  ?Last Pain:  ?Vitals:  ? 08/16/21 1045  ?TempSrc:   ?PainSc: 0-No pain  ? ? ?  ?  ?  ?  ?  ?  ? ?Meiling Hendriks S ? ? ? ? ?

## 2021-08-16 NOTE — Transfer of Care (Signed)
Immediate Anesthesia Transfer of Care Note ? ?Patient: Vincent Silva ? ?Procedure(s) Performed: Procedure(s): ?LEFT TOTAL HIP ARTHROPLASTY ANTERIOR APPROACH (Left) ? ?Patient Location: PACU ? ?Anesthesia Type:Spinal ? ?Level of Consciousness:  sedated, patient cooperative and responds to stimulation ? ?Airway & Oxygen Therapy:Patient Spontanous Breathing and Patient connected to face mask oxgen ? ?Post-op Assessment:  Report given to PACU RN and Post -op Vital signs reviewed and stable ? ?Post vital signs:  Reviewed and stable ? ?Last Vitals:  ?Vitals:  ? 08/16/21 0606  ?BP: (!) 167/94  ?Pulse: (!) 59  ?Resp: 18  ?Temp: 36.7 ?C  ?SpO2: 93%  ? ? ?Complications: No apparent anesthesia complications ? ?

## 2021-08-16 NOTE — Progress Notes (Signed)
PHYSICAL THERAPY TREATMENT ? ?CLINICAL IMPRESSION ?Pt seen for second visit today, vitals taken and were stable. Pt ambulated 144f with min guard level of assist for safety only. Vitals taken post-ambulation and orthostatics were negative. Pt is progressing well. Patient will benefit from continued skilled PT interventions to address impairments and progress towards PLOF. Patient has met mobility goals at adequate level for discharge home; will continue to follow if pt continues acute stay to progress towards Mod I goals. ?  ?Today's Vitals  ? 1605 1616 1630  ?BP: 172/96 (!) 155/90 198/120  ?Pulse: 79 70   ?Position Supine Seated Standing, post-ambulation  ? ? ? 08/16/21 1641  ?PT Visit Information  ?Last PT Received On 08/16/21  ?Assistance Needed +1  ?History of Present Illness Patient is 66y.o. male s/p L THA anterior approach on 08/16/20  with PMH significant for R-THA 04/19/2021, OA, HTN, bil TKA.  ?Precautions  ?Precautions Fall  ?Restrictions  ?Weight Bearing Restrictions Yes  ?LLE Weight Bearing WBAT  ?Pain Assessment  ?Pain Assessment Faces  ?Faces Pain Scale 2  ?Pain Location left hip  ?Pain Descriptors / Indicators Grimacing;Operative site guarding;Cramping  ?Pain Intervention(s) Ice applied;Repositioned;Monitored during session  ?Cognition  ?Arousal/Alertness Awake/alert  ?[facility] assessed/performed  ?Overall Cognitive Status Within Functional Limits for tasks assessed  ?Bed Mobility  ?General bed mobility comments Pt in recliner  ?Transfers  ?Overall transfer level Needs assistance  ?Equipment used Rolling walker (2 wheels)  ?Transfers Sit to/from Stand  ?Sit to Stand Supervision  ?General transfer comment Pt supervision only.  ?Ambulation/Gait  ?Ambulation/Gait assistance Min guard  ?Gait Distance (Feet) 150 Feet  ?Assistive device Rolling walker (2 wheels)  ?Gait Pattern/deviations Decreased step length - left;Decreased stance time - left;Decreased stride  length;Decreased weight shift to left  ?General Gait Details Pt min guard for safety only no physical assist required. Demonstrated increased fluidity with gait, increased reciprocal step-through pattern, and better management of RW.  ?Gait velocity decreased  ?Balance  ?Overall balance assessment Needs assistance  ?Sitting-balance support Feet supported;No upper extremity supported  ?Sitting balance-Leahy Scale Good  ?Standing balance support Bilateral upper extremity supported;During functional activity;Reliant on assistive device for balance  ?Standing balance-Leahy Scale Poor  ?Standing balance comment Pt reliant on BUE support on RW during functional mobility  ?General Comments  ?General comments (skin integrity, edema, etc.) Orthostatic vitals taken, found to be stable  ?PT - End of Session  ?Equipment Utilized During Treatment Gait belt  ?Activity Tolerance Patient tolerated treatment well  ?Patient left in chair;with call bell/phone within reach;with family/visitor present  ?Nurse Communication Mobility status ?(Vitals)  ?PT Assessment  ?PT Visit Diagnosis Unsteadiness on feet (R26.81);Difficulty in walking, not elsewhere classified (R26.2)  ?PT Plan  ?PT Frequency (ACUTE ONLY) 7X/week  ?AM-PAC PT "6 Clicks" Mobility Outcome Measure (Version 2)  ?Help needed turning from your back to your side while in a flat bed without using bedrails? 4  ?Help needed moving from lying on your back to sitting on the side of a flat bed without using bedrails? 4  ?Help needed moving to and from a bed to a chair (including a wheelchair)? 3  ?Help needed standing up from a chair using your arms (e.g., wheelchair or bedside chair)? 3  ?Help needed to walk in hospital room? 3  ?Help needed climbing 3-5 steps with a railing?  3  ?6 Click Score 20  ?Consider Recommendation of Discharge To: Home with no services  ?Progressive Mobility  ?What  is the highest level of mobility based on the progressive mobility assessment? Level 5  (Walks with assist in room/hall) - Balance while stepping forward/back and can walk in room with assist - Complete  ?Activity Ambulated with assistance in hallway  ?PT Recommendation  ?Follow Up Recommendations Follow physician's recommendations for discharge plan and follow up therapies  ?Assistance recommended at discharge Intermittent Supervision/Assistance  ?Patient can return home with the following A little help with walking and/or transfers;A little help with bathing/dressing/bathroom;Assistance with cooking/housework;Assist for transportation;Help with stairs or ramp for entrance  ?PT equipment None recommended by PT ?(pt has recommended DME)  ?Acute Rehab PT Goals  ?Patient Stated Goal To get back to dancing  ?PT Goal Formulation With patient/family  ?Time For Goal Achievement 08/23/21  ?Potential to Achieve Goals Good  ?PT Time Calculation  ?PT Start Time (ACUTE ONLY) 1615  ?PT Stop Time (ACUTE ONLY) 1630  ?PT Time Calculation (min) (ACUTE ONLY) 15 min  ?PT General Charges  ?$$ ACUTE PT VISIT 1 Visit  ?PT Treatments  ?$Gait Training 8-22 mins  ? ?Coolidge Breeze, PT, DPT ?[facility] ?Office: [phone] ?Pager: [phone] ? ?

## 2021-08-16 NOTE — Anesthesia Procedure Notes (Signed)
Spinal ? ?Patient location during procedure: OR ?Start time: 08/16/2021 7:46 AM ?End time: 08/16/2021 7:54 AM ?Reason for block: surgical anesthesia ?Staffing ?Performed: anesthesiologist  ?Anesthesiologist: Eilene Ghazi, MD ?Preanesthetic Checklist ?Completed: patient identified, IV checked, site marked, risks and benefits discussed, surgical consent, monitors and equipment checked, pre-op evaluation and timeout performed ?Spinal Block ?Patient position: sitting ?Prep: Betadine ?Patient monitoring: heart rate, continuous pulse ox and blood pressure ?Approach: midline ?Location: L3-4 ?Injection technique: single-shot ?Needle ?Needle type: Quincke  ?Needle gauge: 22 G ?Needle length: 15 cm ?Assessment ?Sensory level: T6 ?Events: CSF return ?Additional Notes ? ? ? ? ? ? ?

## 2021-08-16 NOTE — Evaluation (Addendum)
Physical Therapy Evaluation ?Patient Details ?Name: Vincent Silva ?MRN: [id number] ?DOB: 12-Feb-1956 ?Today's Date: 08/16/2021 ? ?History of Present Illness ? Patient is 66 y.o. male s/p L THA anterior approach on 08/16/20  with PMH significant for R-THA 04/19/2021, OA, HTN, bil TKA.  ?Clinical Impression ?  ?Vincent Silva is a 66 y.o. male POD 0 s/p L THA anterior approach. Patient reports modified independence with mobility using SPC at baseline. Patient is now limited by functional impairments (see PT problem list below) and requires min guard for transfers and gait with RW. Patient was [place] to ambulate 100 feet with RW and cues for safe walker management; patient educated on safe sequencing for stair mobility and verbalized safe guarding position for people assisting with mobility. Toward the end of ambulation and after attempt to urinate pt demonstrated pallor and sweating. Upon sitting at end of ambulation, pt's vitals taken and found to be highly orthostatic (see chart below) and pt reported dizziness. Adjusted pt's recliner to elevate feet and lower head and with increased time in reclined sitting pt's vitals began stabilizing. Provided ice water and apple juice and encouraged hydration. Informed pt and wife of the importance of stable blood pressure prior to safe discharge home; both verbalized understanding and are agreeable. Will return for second session later this afternoon to trial ambulation again as schedule allows. ?  ? ?Today's Vitals  ? 1400 1421 1423 1427  ?BP: (!) 152/92 (!) 75/61 100/70 119/85  ?Pulse: 70     ?Position supine Sitting (post ambulation) Sitting sitting  ? ?  ? ?Recommendations for follow up therapy are one component of a multi-disciplinary discharge planning process, led by the attending physician.  Recommendations may be updated based on patient status, additional functional criteria and insurance authorization. ? ?Follow Up Recommendations Follow physician's recommendations for  discharge plan and follow up therapies ? ?  ?Assistance Recommended at Discharge Intermittent Supervision/Assistance  ?Patient can return home with the following ? A little help with walking and/or transfers;A little help with bathing/dressing/bathroom;Assistance with cooking/housework;Assist for transportation;Help with stairs or ramp for entrance ? ?  ?Equipment Recommendations None recommended by PT (pt has recommended DME)  ?Recommendations for Other Services ?    ?  ?Functional Status Assessment Patient has had a recent decline in their functional status and/or demonstrates limited ability to make significant improvements in function in a reasonable and predictable amount of time  ? ?  ?Precautions / Restrictions Precautions ?Precautions: Fall ?Restrictions ?Weight Bearing Restrictions: Yes ?LLE Weight Bearing: Weight bearing as tolerated  ? ?  ? ?Mobility ? Bed Mobility ?Overal bed mobility: Needs Assistance ?Bed Mobility: Supine to Sit ?  ?  ?Supine to sit: Min guard, HOB elevated ?  ?  ?General bed mobility comments: Pt min guard for safety only, no physical assist required ?  ? ?Transfers ?Overall transfer level: Needs assistance ?Equipment used: Rolling walker (2 wheels) ?Transfers: Sit to/from Stand ?Sit to Stand: Min guard ?  ?  ?  ?  ?  ?General transfer comment: Pt min guard for safety only, no physical assist required ?  ? ?Ambulation/Gait ?Ambulation/Gait assistance: Min guard ?Gait Distance (Feet): 100 Feet ?Assistive device: Rolling walker (2 wheels) ?Gait Pattern/deviations: Decreased step length - left, Decreased stance time - left, Decreased stride length, Decreased weight shift to left ?Gait velocity: decreased ?  ?  ?General Gait Details: Pt required min guard for safety only, no physical assist required, VCs for proximity to device. Demonstrated decreased WB on  LLE during stance phase, decreased step length on RLE, and step-to pattern. Pt ambulated to bathroom and back and during return to  [place] observed to lose color in face; pt denied any change in status. Upon return to [place] and sitting in recliner pt appeared to be sweating, SOB, and reported dizziness and shut his eyes. vitals were taken and pt found to be highly orthostatic (see vitals flowsheet). Elevated pt's legs, provided apple juice and ice water, and directed pt to perform pursed lip breathing. Vitals improved over ~5 min seated but did not return to pre-ambulation levels. Terminated session and educated pt and wife about importance of stable vitals and relationship to discharge status; verbalized understanding. ? ?Stairs ?Stairs: Yes ?Stairs assistance: Min guard ?Stair Management: One rail Left, Step to pattern, Forwards ?Number of Stairs: 2 ?General stair comments: Pt educated on stair ascent/descent with one rail on the left. Pt verbalized understanding and then demonstrated safe technique; wife present for education and verbalized her understanding as well. ? ?Wheelchair Mobility ?  ? ?Modified Rankin (Stroke Patients Only) ?  ? ?  ? ?Balance Overall balance assessment: Needs assistance ?Sitting-balance support: Feet unsupported, Bilateral upper extremity supported ?Sitting balance-Leahy Scale: Fair ?  ?  ?Standing balance support: Bilateral upper extremity supported, During functional activity, Reliant on assistive device for balance ?Standing balance-Leahy Scale: Poor ?Standing balance comment: Pt reliant on BUE support on RW during functional mobility ?  ?  ?  ?  ?  ?  ?  ?  ?  ?  ?  ?   ? ? ? ?Pertinent Vitals/Pain Pain Assessment ?Pain Assessment: 0-10 ?Pain Score: 5  ?Pain Location: left hip ?Pain Descriptors / Indicators: Grimacing, Operative site guarding, Cramping ?Pain Intervention(s): Limited activity within patient's tolerance, Monitored during session, Repositioned, Ice applied  ? ? 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  ?Home Layout: One level ?Home Equipment: Conservation officer, nature (2 wheels);Cane - single point;BSC/3in1;Toilet riser;Grab bars - tub/shower ?Additional Comments: pt's wife will assist with his recovery  ?  ?Prior Function Prior Level of Function : Independent/Modified Independent ?  ?  ?  ?  ?  ?  ?Mobility Comments: using SPC ?ADLs Comments: IND ?  ? ? ?Hand Dominance  ? Dominant Hand: Right ? ?  ?Extremity/Trunk Assessment  ? Upper Extremity Assessment ?Upper Extremity Assessment: Overall WFL for tasks assessed ?  ? ?Lower Extremity Assessment ?Lower Extremity Assessment: RLE deficits/detail;LLE deficits/detail ?RLE Deficits / Details: MMT: ank df/pf 5/5 ?RLE Sensation: WNL ?LLE Deficits / Details: MMT: ank df/pf 5/5 ?LLE Sensation: WNL ?  ? ?Cervical / Trunk Assessment ?Cervical / Trunk Assessment: Normal  ?Communication  ? Communication: No difficulties  ?Cognition Arousal/Alertness: Awake/alert ?Behavior During Therapy: [facility] for tasks assessed/performed ?Overall Cognitive Status: Within Functional Limits for tasks assessed ?  ?  ?  ?  ?  ?  ?  ?  ?  ?  ?  ?  ?  ?  ?  ?  ?General Comments: Pt very pleasant and eager for PT ?  ?  ? ?  ?General Comments General comments (skin integrity, edema, etc.): Pt demonstrated orthostatic response to exercise (see ambulation and vitals flowsheet). RN notified. ? ?  ?Exercises    ? ?Assessment/Plan  ?  ?PT Assessment  Patient needs continued PT services  ?PT Problem List Decreased coordination;Decreased mobility;Decreased balance;Decreased activity tolerance;Decreased range of motion;Decreased strength;Pain;Cardiopulmonary status limiting activity ? ?   ?  ?PT Treatment Interventions Patient/family education;Neuromuscular re-education;Balance training;Therapeutic exercise;Therapeutic  activities;Functional mobility training;Stair training;Gait training;DME instruction   ? ?PT Goals (Current goals can be found in the Care Plan section)  ?Acute Rehab PT Goals ?Patient Stated Goal: To get back to dancing ?PT

## 2021-08-16 NOTE — Op Note (Signed)
PRE-OP DIAGNOSIS:  LEFT HIP DEGENERATIVE JOINT DISEASE ?POST-OP DIAGNOSIS: same ?PROCEDURE:  LEFT TOTAL HIP ARTHROPLASTY ANTERIOR APPROACH ?ANESTHESIA:  Spinal and MAC ?SURGEON:  Melrose Nakayama MD ?ASSISTANT:  Loni Dolly PA-C ? ? ?INDICATIONS FOR PROCEDURE:  The patient is a 66 y.o. male with a long history of a painful hip.  This has persisted despite multiple conservative measures.  The patient has persisted with pain and dysfunction making rest and activity difficult.  A total hip replacement is offered as surgical treatment.  Informed operative consent was obtained after discussion of possible complications including reaction to anesthesia, infection, neurovascular injury, dislocation, DVT, PE, and death.  The importance of the postoperative rehab program to optimize result was stressed with the patient. ? ?SUMMARY OF FINDINGS AND PROCEDURE:  Under the above anesthesia through a anterior approach an the Hana table a left THR was performed.  The patient had severe degenerative change and excellent bone quality.  We used DePuy components to replace the hip and these were size 9 high offset Actis femur capped with a -2 45m metal hip ball.  On the acetabular side we used a size 54 Gription shell with a plus 4 neutral polyethylene liner.  We did use a hole eliminator.  ALoni DollyPA-C assisted throughout and was invaluable to the completion of the case in that he helped position and retract while I performed the procedure.  He also closed simultaneously to help minimize OR time.  I used fluoroscopy throughout the case to check position of components and leg lengths and read all these views myself. ? ?DESCRIPTION OF PROCEDURE:  The patient was taken to the OR suite where the above anesthetic was applied.  The patient was then positioned on the Hana table supine.  All bony prominences were appropriately padded.  Prep and drape was then performed in normal sterile fashion.  The patient was given kefzol preoperative  antibiotic and an appropriate time out was performed.  We then took an anterior approach to the left hip.  Dissection was taken through adipose to the tensor fascia lata fascia.  This structure was incised longitudinally and we dissected in the intermuscular interval just medial to this muscle.  Cobra retractors were placed superior and inferior to the femoral neck superficial to the capsule.  A capsular incision was then made and the retractors were placed along the femoral neck.  Xray was brought in to get a good level for the femoral neck cut which was made with an oscillating saw and osteotome.  The femoral head was removed with a corkscrew.  The acetabulum was exposed and some labral tissues were excised. Reaming was taken to the inside wall of the pelvis and sequentially up to 1 mm smaller than the actual component.  A trial of components was done and then the aforementioned acetabular shell was placed in appropriate tilt and anteversion confirmed by fluoroscopy. The liner was placed along with the hole eliminator and attention was turned to the femur.  The leg was brought down and over into adduction and the elevator bar was used to raise the femur up gently in the wound.  The piriformis was released with care taken to preserve the obturator internus attachment and all of the posterior capsule. The femur was reamed and then broached to the appropriate size.  A trial reduction was done and the aforementioned head and neck assembly gave uKoreathe best stability in extension with external rotation.  Leg lengths were felt to be about equal  by fluoroscopic exam.  The trial components were removed and the wound irrigated.  We then placed the femoral component in appropriate anteversion.  The head was applied to a dry stem neck and the hip again reduced.  It was again stable in the aforementioned position.  The would was irrigated again followed by re-approximation of anterior capsule with ethibond suture. Tensor  fascia was repaired with V-loc suture  followed by deep closure with #O and #2 undyed vicryl.  Skin was closed with subQ stitch and steristrips followed by a sterile dressing.  EBL and IOF can be obtained from anesthesia records. ? ?DISPOSITION:  The patient was taken to PACU to potentially go home same day depending on ability to walk and tolerate liquids.  ?

## 2021-08-16 NOTE — Interval H&P Note (Signed)
History and Physical Interval Note: ? ?08/16/2021 ?7:34 AM ? ?Vincent Silva  has presented today for surgery, with the diagnosis of LEFT HIP DEGENERATIVE JOINT DISEASE.  The various methods of treatment have been discussed with the patient and family. After consideration of risks, benefits and other options for treatment, the patient has consented to  Procedure(s): ?LEFT TOTAL HIP ARTHROPLASTY ANTERIOR APPROACH (Left) as a surgical intervention.  The patient's history has been reviewed, patient examined, no change in status, stable for surgery.  I have reviewed the patient's chart and labs.  Questions were answered to the patient's satisfaction.   ? ? ?[name] ? ? ?

## 2021-08-17 ENCOUNTER — Encounter (HOSPITAL_COMMUNITY): Payer: Self-pay | Admitting: Orthopaedic Surgery

## 2021-08-26 DIAGNOSIS — Z96643 Presence of artificial hip joint, bilateral: Secondary | ICD-10-CM | POA: Diagnosis not present

## 2021-09-16 DIAGNOSIS — Z96642 Presence of left artificial hip joint: Secondary | ICD-10-CM | POA: Diagnosis not present

## 2021-09-16 DIAGNOSIS — R7301 Impaired fasting glucose: Secondary | ICD-10-CM | POA: Diagnosis not present

## 2021-09-16 DIAGNOSIS — E7800 Pure hypercholesterolemia, unspecified: Secondary | ICD-10-CM | POA: Diagnosis not present

## 2021-09-16 DIAGNOSIS — Z6841 Body Mass Index (BMI) 40.0 and over, adult: Secondary | ICD-10-CM | POA: Diagnosis not present

## 2021-09-16 DIAGNOSIS — M62831 Muscle spasm of calf: Secondary | ICD-10-CM | POA: Diagnosis not present

## 2021-09-20 DIAGNOSIS — M159 Polyosteoarthritis, unspecified: Secondary | ICD-10-CM | POA: Diagnosis not present

## 2021-09-20 DIAGNOSIS — E7800 Pure hypercholesterolemia, unspecified: Secondary | ICD-10-CM | POA: Diagnosis not present

## 2021-09-20 DIAGNOSIS — Z0000 Encounter for general adult medical examination without abnormal findings: Secondary | ICD-10-CM | POA: Diagnosis not present

## 2021-09-20 DIAGNOSIS — I10 Essential (primary) hypertension: Secondary | ICD-10-CM | POA: Diagnosis not present

## 2021-09-28 DIAGNOSIS — M62831 Muscle spasm of calf: Secondary | ICD-10-CM | POA: Diagnosis not present

## 2021-09-28 DIAGNOSIS — Z96642 Presence of left artificial hip joint: Secondary | ICD-10-CM | POA: Diagnosis not present

## 2021-09-30 DIAGNOSIS — Z96642 Presence of left artificial hip joint: Secondary | ICD-10-CM | POA: Diagnosis not present

## 2021-09-30 DIAGNOSIS — M62831 Muscle spasm of calf: Secondary | ICD-10-CM | POA: Diagnosis not present

## 2021-10-03 DIAGNOSIS — Z96642 Presence of left artificial hip joint: Secondary | ICD-10-CM | POA: Diagnosis not present

## 2021-10-03 DIAGNOSIS — M62831 Muscle spasm of calf: Secondary | ICD-10-CM | POA: Diagnosis not present

## 2021-10-05 DIAGNOSIS — Z96642 Presence of left artificial hip joint: Secondary | ICD-10-CM | POA: Diagnosis not present

## 2021-10-05 DIAGNOSIS — M62831 Muscle spasm of calf: Secondary | ICD-10-CM | POA: Diagnosis not present

## 2021-11-14 DIAGNOSIS — Z96642 Presence of left artificial hip joint: Secondary | ICD-10-CM | POA: Diagnosis not present

## 2022-05-07 IMAGING — RF DG HIP (WITH OR WITHOUT PELVIS) 1V*R*
1 series · 2 of 2 positions shown · non-contrast
Comparison: None.

CLINICAL DATA: Fluoroscopic assistance for right hip arthroplasty

EXAM:
DG HIP (WITH OR WITHOUT PELVIS) 1V RIGHT

[Series 1: unknown protocol · 0.20mm/px · 2 of 2 slices shown]
[im 1/2]
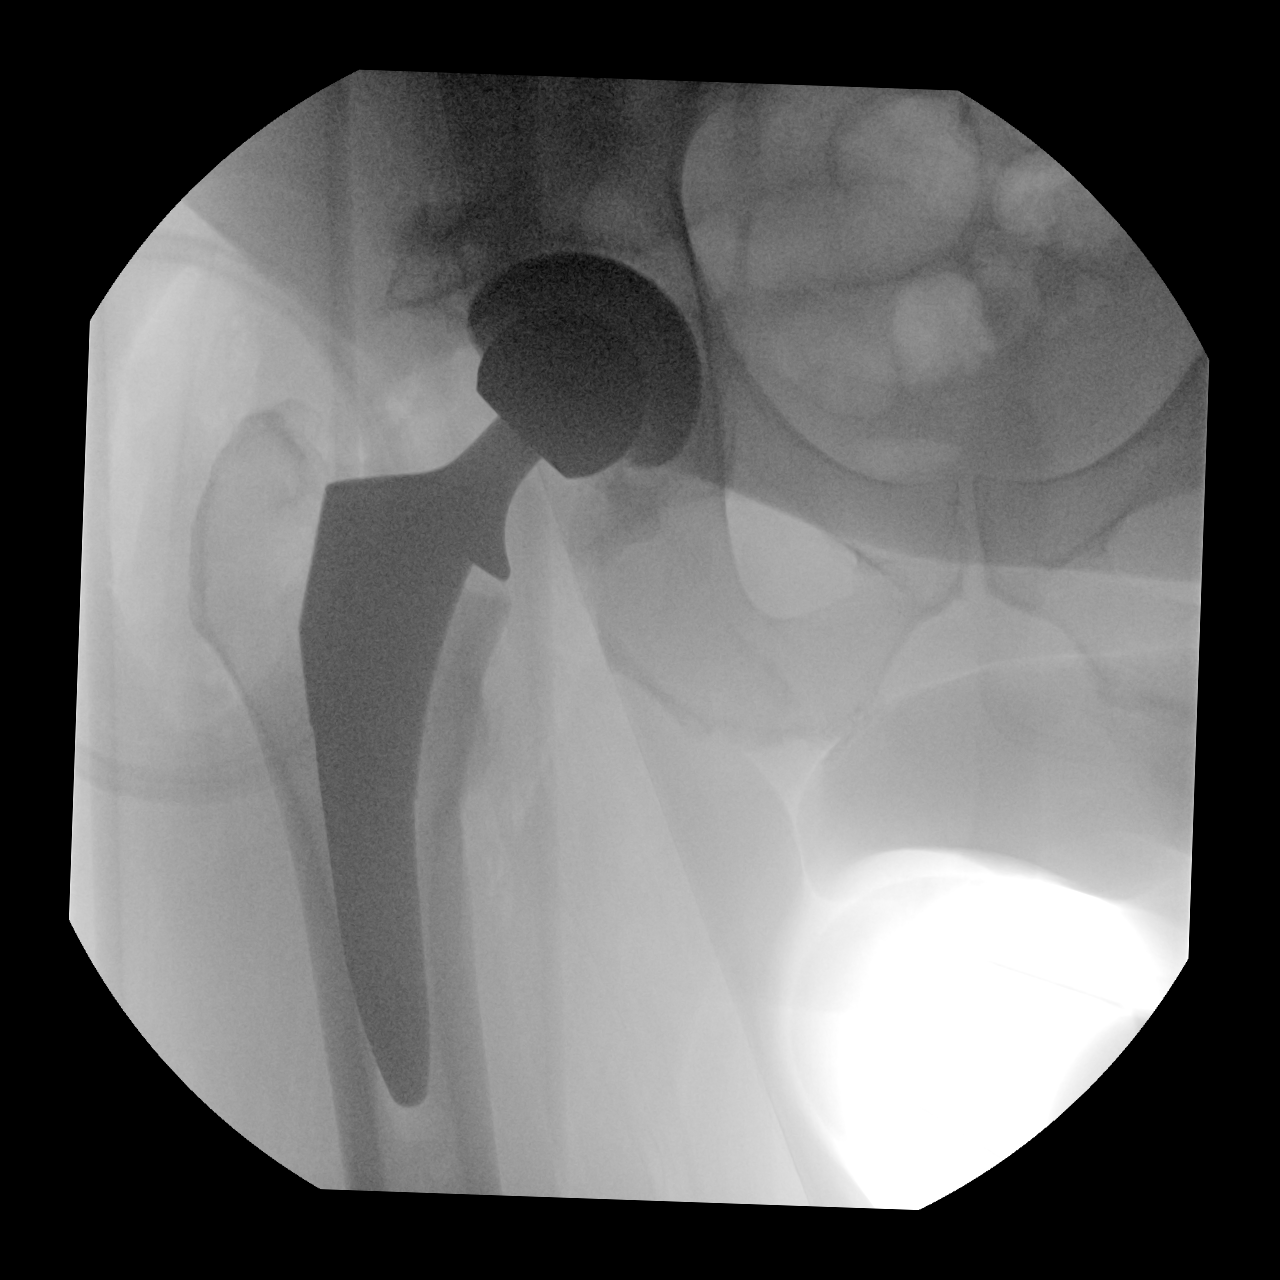
[im 2/2]
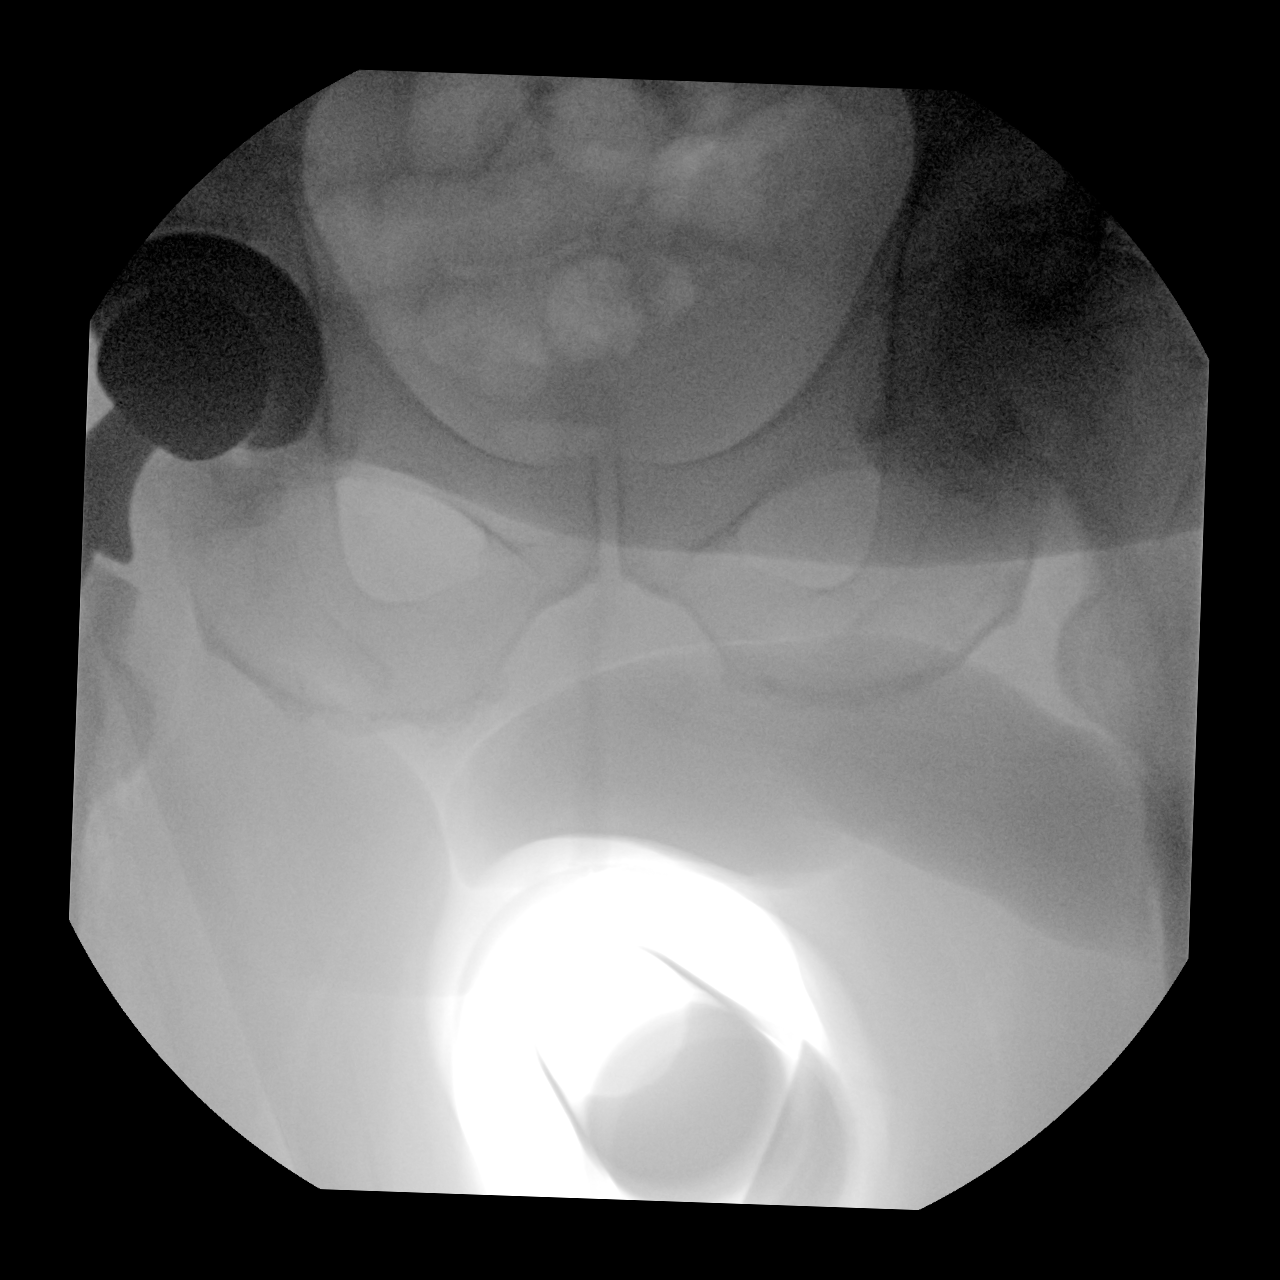

[2 of 2 positions shown; findings below may reference images not displayed]

FINDINGS: Fluoroscopic images show evidence of right hip arthroplasty.
Fluoroscopic time was 14 seconds. Radiation dose is 3.89 mGy. Two
images are submitted for review.
IMPRESSION: Fluoroscopic assistance was provided for right hip arthroplasty.

## 2022-07-13 ENCOUNTER — Other Ambulatory Visit (HOSPITAL_BASED_OUTPATIENT_CLINIC_OR_DEPARTMENT_OTHER): Payer: Self-pay | Admitting: Rheumatology

## 2022-07-13 DIAGNOSIS — E7800 Pure hypercholesterolemia, unspecified: Secondary | ICD-10-CM

## 2022-08-18 ENCOUNTER — Encounter (HOSPITAL_BASED_OUTPATIENT_CLINIC_OR_DEPARTMENT_OTHER): Payer: Self-pay

## 2022-08-18 ENCOUNTER — Ambulatory Visit (HOSPITAL_BASED_OUTPATIENT_CLINIC_OR_DEPARTMENT_OTHER): Payer: BC Managed Care – PPO

## 2022-09-03 IMAGING — RF DG HIP (WITH OR WITHOUT PELVIS) 1V*L*
1 series · 2 of 2 positions shown · non-contrast
Comparison: None.

CLINICAL DATA: Fluoroscopic assistance for left hip arthroplasty.

EXAM:
DG HIP (WITH OR WITHOUT PELVIS) 1V*L*

[Series 1: unknown protocol · 0.20mm/px · 2 of 2 slices shown]
[im 1/2]
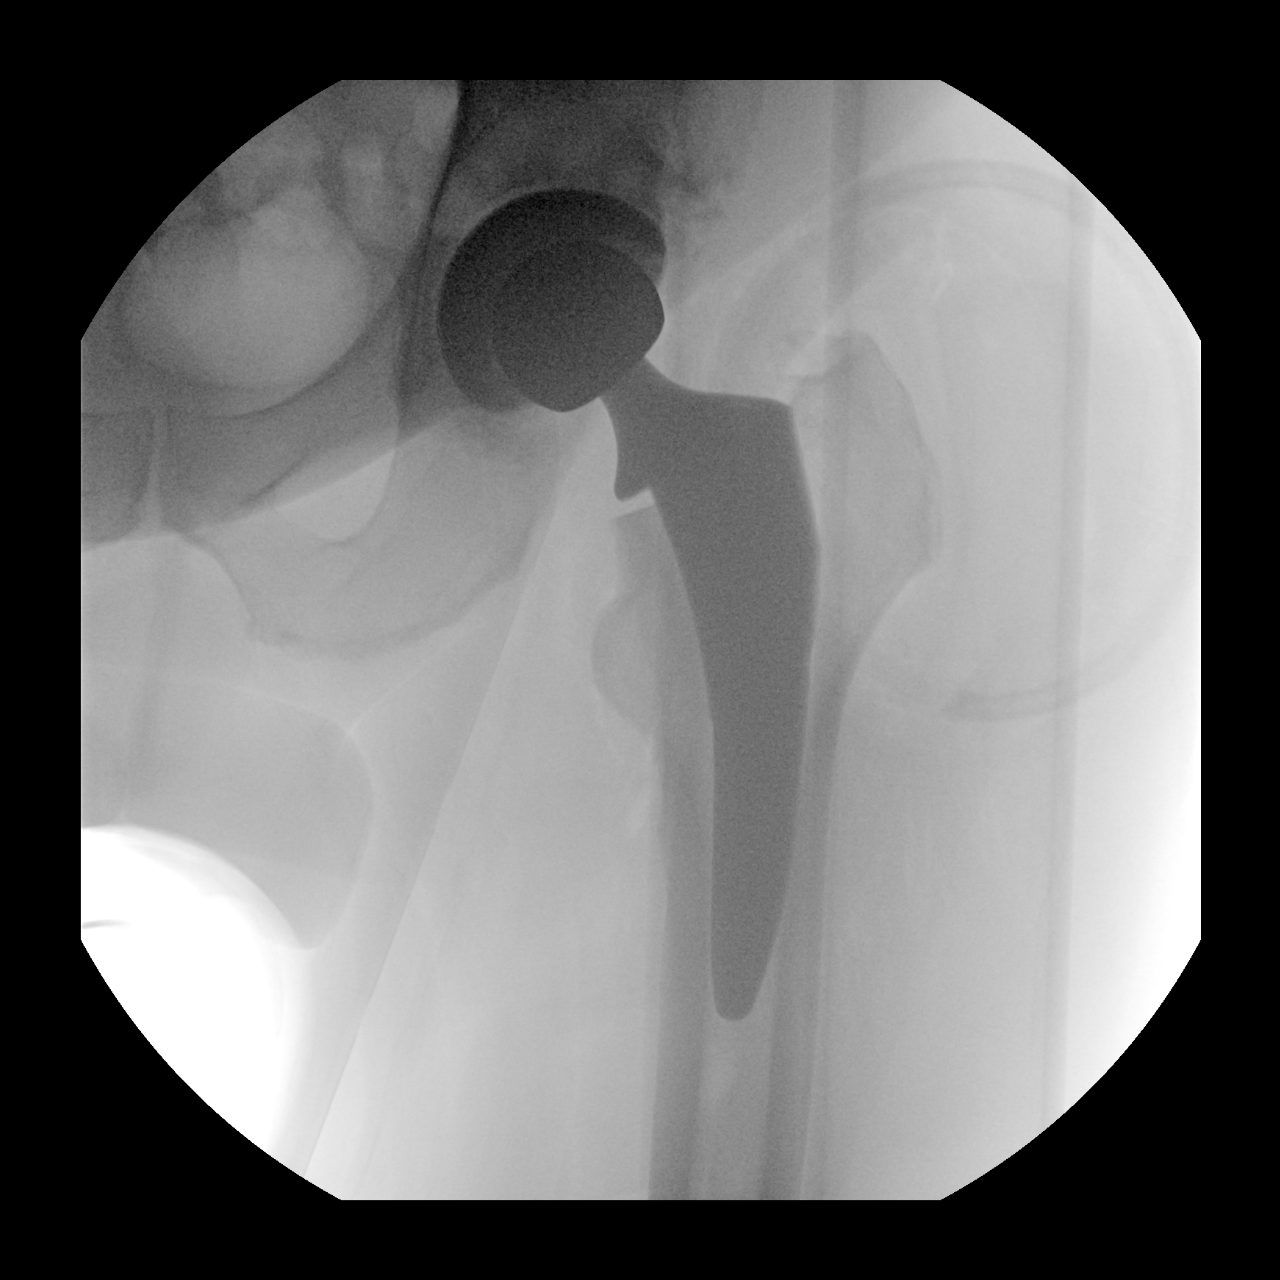
[im 2/2]
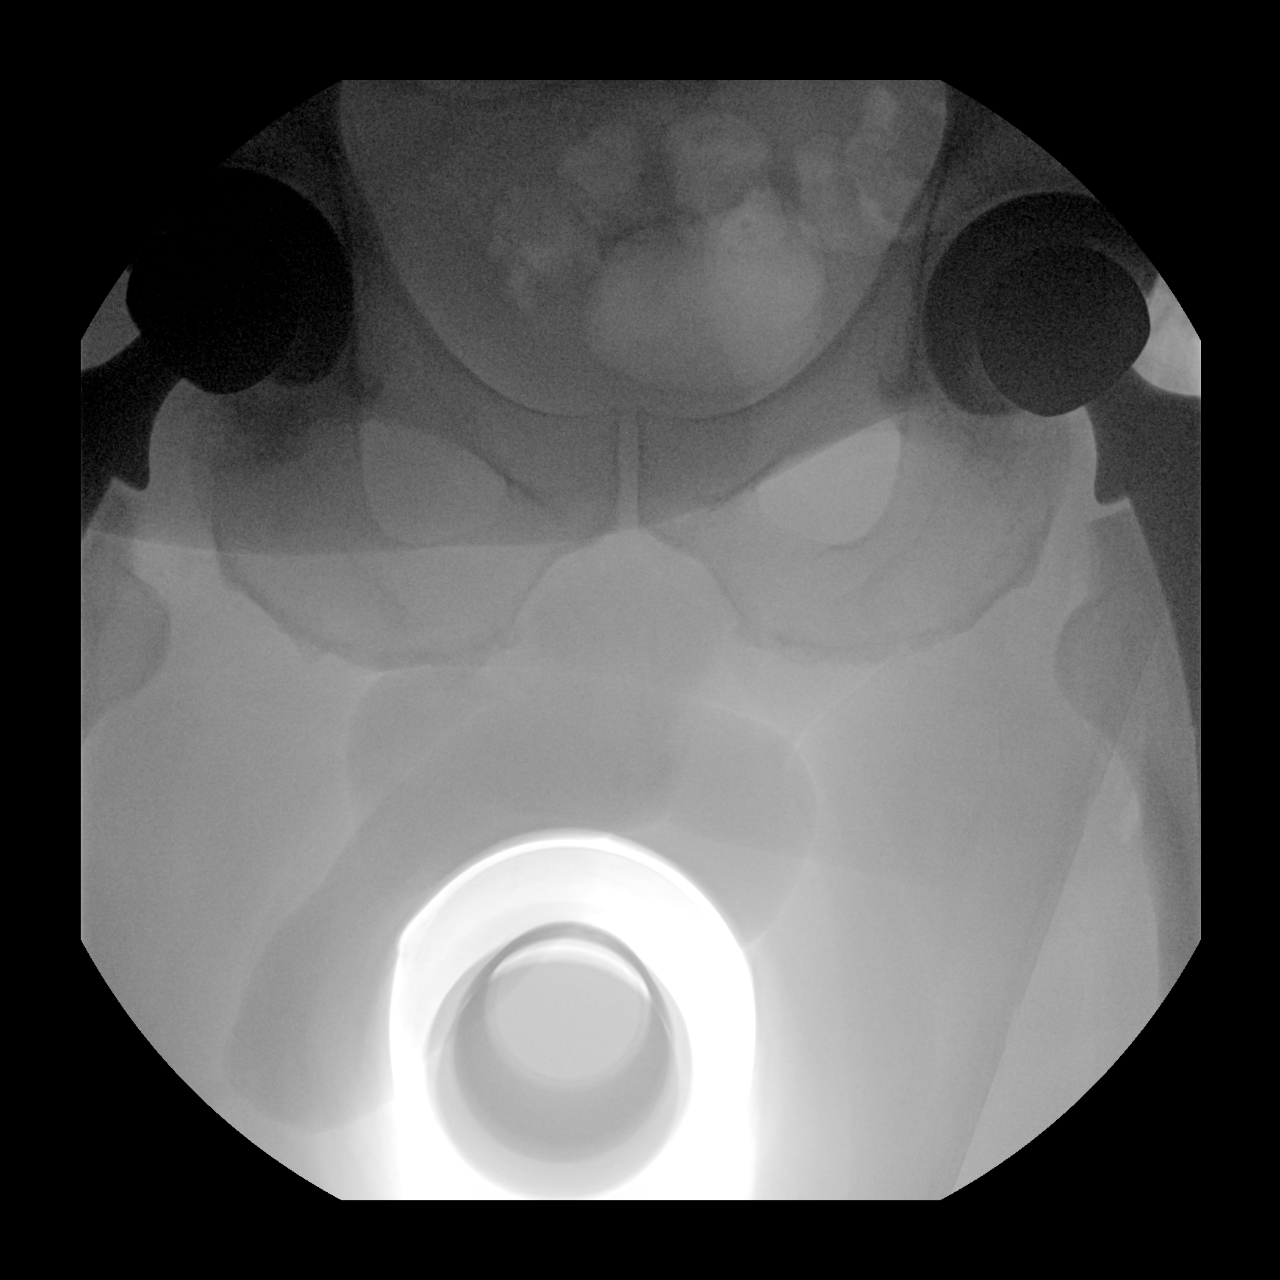

[2 of 2 positions shown; findings below may reference images not displayed]

FINDINGS: Fluoroscopic images show left hip arthroplasty. Fluoroscopic time
was 14 seconds. Radiation dose was 5.48 mGy.
IMPRESSION: Fluoroscopic assistance was provided for left hip arthroplasty.

## 2024-05-31 ENCOUNTER — Ambulatory Visit: Admitting: Radiology

## 2024-05-31 ENCOUNTER — Encounter: Payer: Self-pay | Admitting: *Deleted

## 2024-05-31 ENCOUNTER — Ambulatory Visit
Admission: EM | Admit: 2024-05-31 | Discharge: 2024-05-31 | Disposition: A | Discharge status: Home / Self Care | Attending: Emergency Medicine | Admitting: Emergency Medicine

## 2024-05-31 ENCOUNTER — Ambulatory Visit (HOSPITAL_COMMUNITY)

## 2024-05-31 ENCOUNTER — Other Ambulatory Visit: Payer: Self-pay

## 2024-05-31 DIAGNOSIS — R051 Acute cough: Secondary | ICD-10-CM | POA: Diagnosis not present

## 2024-05-31 DIAGNOSIS — J988 Other specified respiratory disorders: Secondary | ICD-10-CM

## 2024-05-31 DIAGNOSIS — B9789 Other viral agents as the cause of diseases classified elsewhere: Secondary | ICD-10-CM | POA: Diagnosis not present

## 2024-05-31 LAB — POC COVID19/FLU A&B COMBO
Covid Antigen, POC: NEGATIVE
Influenza A Antigen, POC: NEGATIVE
Influenza B Antigen, POC: NEGATIVE

## 2024-05-31 MED ORDER — PREDNISONE 20 MG PO TABS: 40.0000 mg | ORAL_TABLET | Freq: Every day | ORAL | 0 refills | Status: AC

## 2024-05-31 MED ORDER — ALBUTEROL SULFATE HFA 108 (90 BASE) MCG/ACT IN AERS: 1.0000 | INHALATION_SPRAY | Freq: Four times a day (QID) | RESPIRATORY_TRACT | 0 refills | Status: AC | PRN

## 2024-05-31 MED ORDER — BENZONATATE 100 MG PO CAPS: 100.0000 mg | ORAL_CAPSULE | Freq: Three times a day (TID) | ORAL | 0 refills | Status: AC

## 2024-05-31 MED ORDER — AEROCHAMBER PLUS FLO-VU MEDIUM MISC
1.0000 | Freq: Once | Status: AC
  Administered 2024-05-31: 1

## 2024-05-31 NOTE — ED Triage Notes (Addendum)
 C/O persistent cough, HA, body aches, poor energy onset  2 days ago. No known fevers. Has been taking Tyl and Nyquil (last dose of any meds @ 0900). Denies dyspnea.

## 2024-05-31 NOTE — Discharge Instructions (Addendum)
 Your COVID and flu testing are both negative.  Your x-ray is negative for any underlying pneumonia.  I believe your symptoms are likely related to a viral respiratory illness. You can use albuterol  inhaler 1 to 2 puffs every 6 hours as needed for shortness of breath and any wheezing. Start taking 2 tablets of prednisone  once daily for 5 days to help with inflammation related to your symptoms. You can take Tessalon  every 8 hours as needed for cough. You can take over-the-counter Coricidin for cough and congestion.  This medication is safe with your history of high blood pressure. You can take 500 to 1000 mg of Tylenol  every 6-8 hours as needed for headache, any pain, or fever. Make sure you are staying hydrated and getting plenty of rest. Follow-up with your primary care provider or return here as needed.

## 2024-05-31 NOTE — ED Provider Notes (Signed)
 " [facility]    CSN: [id number] Arrival date & time: 05/31/24  1157      History   Chief Complaint Chief Complaint  Patient presents with   Cough    Appt 1200    HPI Vincent Silva is a 69 y.o. male.   Patient presents with cough, body aches, fatigue, and headache that began on 1/1.  Patient states that he has also had some shortness of breath and some chest soreness with coughing.  Patient states that when he lies flat he becomes more short of breath and coughs more frequently.    Patient states that yesterday he did feel like he had some significant chest congestion but was [place] to clear this, and states that he does not feel like he has chest congestion now but does continue to have some shortness of breath with exertion and lying flat.  Patient reports that he has been taking Tylenol  and NyQuil for his symptoms.    Patient denies any known fevers, nausea, vomiting, diarrhea, and abdominal pain.  Patient denies any known sick contacts.  Patient also denies any history of asthma or COPD but does have sleep apnea.    Patient also has a history of hypertension.  Patient reports that he did take his blood pressure medication today and has been taking this regularly.  The history is provided by the patient and medical records.  Cough   Past Medical History:  Diagnosis Date   Arthritis    Hypertension    Sleep apnea    wears cpap    Patient Active Problem List   Diagnosis Date Noted   Primary osteoarthritis of left hip 08/16/2021   Primary osteoarthritis of right hip 04/19/2021    Past Surgical History:  Procedure Laterality Date   APPENDECTOMY     JOINT REPLACEMENT Bilateral    knee   LAPAROSCOPIC GASTRIC BANDING     TOTAL HIP ARTHROPLASTY Right 04/19/2021   Procedure: RIGHT TOTAL HIP ARTHROPLASTY ANTERIOR APPROACH;  Surgeon: Sheril Coy, MD;  Location: [facility];  Service: Orthopedics;  Laterality: Right;   TOTAL HIP ARTHROPLASTY Left 08/16/2021    Procedure: LEFT TOTAL HIP ARTHROPLASTY ANTERIOR APPROACH;  Surgeon: Sheril Coy, MD;  Location: [facility];  Service: Orthopedics;  Laterality: Left;       Home Medications    Prior to Admission medications  Medication Sig Start Date End Date Taking? Authorizing Provider  albuterol  (VENTOLIN  HFA) 108 (90 Base) MCG/ACT inhaler Inhale 1-2 puffs into the lungs every 6 (six) hours as needed for wheezing or shortness of breath. 05/31/24  Yes Johnie, Jakhiya Brower A, NP  benzonatate  (TESSALON ) 100 MG capsule Take 1 capsule (100 mg total) by mouth every 8 (eight) hours. 05/31/24  Yes Johnie, Rozell Theiler A, NP  cyclobenzaprine  (FLEXERIL ) 5 MG tablet Take 1 tablet (5 mg total) by mouth every 6 (six) hours. As needed for pain and spasm 08/16/21  Yes Lenis Barter, PA-C  lisinopril-hydrochlorothiazide (ZESTORETIC) 20-25 MG tablet Take 2 tablets by mouth daily.   Yes [provider]  metoprolol succinate (TOPROL-XL) 50 MG 24 hr tablet Take 50 mg by mouth daily. Take with or immediately following a meal.   Yes [provider]  predniSONE  (DELTASONE ) 20 MG tablet Take 2 tablets (40 mg total) by mouth daily for 5 days. 05/31/24 06/05/24 Yes Keriann Rankin A, NP  tadalafil (CIALIS) 20 MG tablet Take 20 mg by mouth daily as needed for erectile dysfunction.    [provider]  Family History No family history on file.  Social History Social History[1]   Allergies   Patient has no known allergies.   Review of Systems Review of Systems  Respiratory:  Positive for cough.    Per HPI  Physical Exam Triage Vital Signs ED Triage Vitals  Encounter Vitals Group     BP 05/31/24 1212 (!) 171/96     Girls Systolic BP Percentile --      Girls Diastolic BP Percentile --      Boys Systolic BP Percentile --      Boys Diastolic BP Percentile --      Pulse Rate 05/31/24 1212 79     Resp 05/31/24 1212 20     Temp 05/31/24 1212 99.3 F (37.4 C)     Temp Source 05/31/24 1212 Oral     SpO2  05/31/24 1212 91 %     Weight --      Height --      Head Circumference --      Peak Flow --      Pain Score 05/31/24 1213 6     Pain Loc --      Pain Education --      Exclude from Growth Chart --    No data found.  Updated Vital Signs BP (!) 171/96   Pulse 79   Temp 99.3 F (37.4 C) (Oral)   Resp 20   SpO2 94%   Visual Acuity Right Eye Distance:   Left Eye Distance:   Bilateral Distance:    Right Eye Near:   Left Eye Near:    Bilateral Near:     Physical Exam Vitals and nursing note reviewed.  Constitutional:      General: He is awake. He is not in acute distress.    Appearance: Normal appearance. He is well-developed and well-groomed. He is not ill-appearing.  HENT:     Right Ear: Tympanic membrane, ear canal and external ear normal.     Left Ear: Tympanic membrane, ear canal and external ear normal.     Nose: Congestion and rhinorrhea present.     Mouth/Throat:     Mouth: Mucous membranes are moist.     Pharynx: Posterior oropharyngeal erythema and postnasal drip present. No oropharyngeal exudate.     Tonsils: No tonsillar exudate.  Cardiovascular:     Rate and Rhythm: Normal rate and regular rhythm.  Pulmonary:     Effort: Pulmonary effort is normal.     Breath sounds: Normal breath sounds.  Skin:    General: Skin is warm and dry.  Neurological:     General: No focal deficit present.     Mental Status: He is alert and oriented to person, place, and time. Mental status is at baseline.  Psychiatric:        Behavior: Behavior is cooperative.      UC Treatments / Results  Labs (all labs ordered are listed, but only abnormal results are displayed) Labs Reviewed  POC COVID19/FLU A&B COMBO - Normal    EKG   Radiology DG Chest 2 View Result Date: 05/31/2024 EXAM: 2 VIEW(S) XRAY OF THE CHEST 05/31/2024 01:05:58 PM COMPARISON: 04/14/2021 CLINICAL HISTORY: cough with shortness of breath, chest soreness with cough, low SpO2 FINDINGS: LUNGS AND PLEURA:  Linear scarring or atelectasis in left mid lung. No pleural effusion. No pneumothorax. No consolidative change. HEART AND MEDIASTINUM: No acute abnormality of the cardiac and mediastinal silhouettes. BONES AND SOFT TISSUES: Intact thoracic cage with multilevel  bridging enthesopathy of thoracic spine. IMPRESSION: 1. Linear scarring or atelectasis in the left mid lung. Electronically signed by: Waddell Calk MD 05/31/2024 01:20 PM EST RP Workstation: [id number]    Procedures Procedures (including critical care time)  Medications Ordered in UC Medications  AeroChamber Plus Flo-Vu Medium MISC 1 each (1 each Other Given 05/31/24 1355)    Initial Impression / Assessment and Plan / UC Course  I have reviewed the triage vital signs and the nursing notes.  Pertinent labs & imaging results that were available during my care of the patient were reviewed by me and considered in my medical decision making (see chart for details).     Patient is overall well-appearing.  Vitals are overall stable.  Oxygen was initially low at 91% but did improve to 94% while in clinic.  Blood pressure was elevated.  Patient reports that he did take his blood pressure medication today.  COVID and flu testing negative.  Chest x-ray ordered.  I independently interpreted these images and there does not appear to be any evidence of pneumonia at this time.  Radiology report suggests linear scarring or atelectasis in the left midlung, but no evidence of pneumonia at this time.  Suspect symptoms are likely viral in nature.  Prescribed albuterol  inhaler to use as needed for shortness of breath and any wheezing.  Prescribed short prednisone  burst to help with shortness of breath and inflammation related to this.  Prescribed Tessalon  as needed for cough.  Discussed over-the-counter medications as needed for symptoms.  Discussed follow-up, return, and strict ER precautions. Final Clinical Impressions(s) / UC Diagnoses   Final diagnoses:   Acute cough  Viral respiratory illness     Discharge Instructions      Your COVID and flu testing are both negative.  Your x-ray is negative for any underlying pneumonia.  I believe your symptoms are likely related to a viral respiratory illness. You can use albuterol  inhaler 1 to 2 puffs every 6 hours as needed for shortness of breath and any wheezing. Start taking 2 tablets of prednisone  once daily for 5 days to help with inflammation related to your symptoms. You can take Tessalon  every 8 hours as needed for cough. You can take over-the-counter Coricidin for cough and congestion.  This medication is safe with your history of high blood pressure. You can take 500 to 1000 mg of Tylenol  every 6-8 hours as needed for headache, any pain, or fever. Make sure you are staying hydrated and getting plenty of rest. Follow-up with your primary care provider or return here as needed.     ED Prescriptions     Medication Sig Dispense Auth. Provider   albuterol  (VENTOLIN  HFA) 108 (90 Base) MCG/ACT inhaler Inhale 1-2 puffs into the lungs every 6 (six) hours as needed for wheezing or shortness of breath. 18 g Johnie Flaming A, NP   predniSONE  (DELTASONE ) 20 MG tablet Take 2 tablets (40 mg total) by mouth daily for 5 days. 10 tablet Johnie Flaming A, NP   benzonatate  (TESSALON ) 100 MG capsule Take 1 capsule (100 mg total) by mouth every 8 (eight) hours. 21 capsule Johnie Flaming A, NP      PDMP not reviewed this encounter.     [1]  Social History Tobacco Use   Smoking status: Never   Smokeless tobacco: Never  Vaping Use   Vaping status: Never Used  Substance Use Topics   Alcohol use: Yes    Comment: occasional   Drug use: Not Currently  Johnie Flaming A, NP 05/31/24 1412  "
